# Patient Record
Sex: Female | Born: 1935
Health system: Southern US, Community
[De-identification: ages and names within clinical notes are randomized; demographics above are authoritative.]

## PROBLEM LIST (undated history)

## (undated) DIAGNOSIS — E785 Hyperlipidemia, unspecified: Secondary | ICD-10-CM

## (undated) DIAGNOSIS — M1990 Unspecified osteoarthritis, unspecified site: Secondary | ICD-10-CM

## (undated) DIAGNOSIS — F419 Anxiety disorder, unspecified: Secondary | ICD-10-CM

## (undated) DIAGNOSIS — I10 Essential (primary) hypertension: Secondary | ICD-10-CM

## (undated) DIAGNOSIS — M810 Age-related osteoporosis without current pathological fracture: Secondary | ICD-10-CM

## (undated) HISTORY — DX: Age-related osteoporosis without current pathological fracture: M81.0

## (undated) HISTORY — DX: Anxiety disorder, unspecified: F41.9

## (undated) HISTORY — DX: Hyperlipidemia, unspecified: E78.5

## (undated) HISTORY — DX: Unspecified osteoarthritis, unspecified site: M19.90

---

## 1997-10-24 HISTORY — PX: CHOLECYSTECTOMY: SHX55

## 1998-10-24 DIAGNOSIS — J301 Allergic rhinitis due to pollen: Secondary | ICD-10-CM | POA: Insufficient documentation

## 1998-10-24 DIAGNOSIS — N814 Uterovaginal prolapse, unspecified: Secondary | ICD-10-CM | POA: Insufficient documentation

## 1998-10-24 DIAGNOSIS — I10 Essential (primary) hypertension: Secondary | ICD-10-CM | POA: Insufficient documentation

## 2001-10-24 DIAGNOSIS — E785 Hyperlipidemia, unspecified: Secondary | ICD-10-CM | POA: Insufficient documentation

## 2003-10-25 DIAGNOSIS — M810 Age-related osteoporosis without current pathological fracture: Secondary | ICD-10-CM | POA: Insufficient documentation

## 2004-08-24 ENCOUNTER — Ambulatory Visit: Payer: Self-pay | Admitting: Obstetrics and Gynecology

## 2005-09-23 ENCOUNTER — Ambulatory Visit: Payer: Self-pay | Admitting: Obstetrics and Gynecology

## 2007-09-03 ENCOUNTER — Ambulatory Visit: Payer: Self-pay | Admitting: Family Medicine

## 2008-07-24 ENCOUNTER — Ambulatory Visit: Payer: Self-pay | Admitting: Family Medicine

## 2009-06-25 DIAGNOSIS — M545 Low back pain, unspecified: Secondary | ICD-10-CM | POA: Insufficient documentation

## 2009-08-03 DIAGNOSIS — E559 Vitamin D deficiency, unspecified: Secondary | ICD-10-CM | POA: Insufficient documentation

## 2009-10-24 HISTORY — PX: OTHER SURGICAL HISTORY: SHX169

## 2010-06-22 ENCOUNTER — Ambulatory Visit: Payer: Self-pay | Admitting: Obstetrics and Gynecology

## 2010-08-18 ENCOUNTER — Ambulatory Visit: Payer: Self-pay | Admitting: Family Medicine

## 2010-08-24 ENCOUNTER — Ambulatory Visit: Payer: Self-pay | Admitting: Specialist

## 2010-08-31 ENCOUNTER — Ambulatory Visit: Payer: Self-pay | Admitting: Specialist

## 2010-09-01 LAB — PATHOLOGY REPORT

## 2011-09-30 ENCOUNTER — Ambulatory Visit: Payer: Self-pay | Admitting: Obstetrics and Gynecology

## 2013-08-01 ENCOUNTER — Ambulatory Visit: Payer: Self-pay | Admitting: Family Medicine

## 2013-08-01 LAB — HM DEXA SCAN

## 2014-07-22 LAB — BASIC METABOLIC PANEL
BUN: 9 mg/dL (ref 4–21)
Creatinine: 0.8 mg/dL (ref 0.5–1.1)
GLUCOSE: 94 mg/dL
Potassium: 4 mmol/L (ref 3.4–5.3)
SODIUM: 135 mmol/L — AB (ref 137–147)

## 2014-07-22 LAB — LIPID PANEL
Cholesterol: 160 mg/dL (ref 0–200)
HDL: 64 mg/dL (ref 35–70)
LDL CALC: 80 mg/dL
Triglycerides: 80 mg/dL (ref 40–160)

## 2015-02-14 ENCOUNTER — Emergency Department
Admit: 2015-02-14 | Disposition: A | Discharge status: Home / Self Care | Payer: Self-pay | Admitting: Emergency Medicine

## 2015-02-14 LAB — HEPATIC FUNCTION PANEL A (ARMC)
ALK PHOS: 63 U/L
AST: 22 U/L
Albumin: 3.8 g/dL
Bilirubin, Direct: 0.1 mg/dL
Bilirubin,Total: 0.7 mg/dL
Indirect Bilirubin: 0.6
SGPT (ALT): 13 U/L — ABNORMAL LOW
Total Protein: 7.5 g/dL

## 2015-02-14 LAB — URINALYSIS, COMPLETE
Bilirubin,UR: NEGATIVE
Glucose,UR: NEGATIVE mg/dL
Ketone: NEGATIVE
Nitrite: NEGATIVE
Ph: 6
Protein: 30
Specific Gravity: 1.01

## 2015-02-14 LAB — CBC WITH DIFFERENTIAL/PLATELET
Basophil #: 0.1 10*3/uL (ref 0.0–0.1)
Basophil %: 1.2 %
EOS PCT: 1.4 %
Eosinophil #: 0.1 10*3/uL (ref 0.0–0.7)
HCT: 36.4 % (ref 35.0–47.0)
HGB: 12.2 g/dL (ref 12.0–16.0)
LYMPHS PCT: 31.1 %
Lymphocyte #: 2.3 10*3/uL (ref 1.0–3.6)
MCH: 30.5 pg (ref 26.0–34.0)
MCHC: 33.4 g/dL (ref 32.0–36.0)
MCV: 91 fL (ref 80–100)
MONO ABS: 0.6 x10 3/mm (ref 0.2–0.9)
MONOS PCT: 7.8 %
NEUTROS PCT: 58.5 %
Neutrophil #: 4.3 10*3/uL (ref 1.4–6.5)
PLATELETS: 234 10*3/uL (ref 150–440)
RBC: 3.99 10*6/uL (ref 3.80–5.20)
RDW: 13.1 % (ref 11.5–14.5)
WBC: 7.3 10*3/uL (ref 3.6–11.0)

## 2015-02-14 LAB — BASIC METABOLIC PANEL
Anion Gap: 6 — ABNORMAL LOW (ref 7–16)
BUN: 13 mg/dL
CALCIUM: 9.1 mg/dL
CHLORIDE: 101 mmol/L
Co2: 29 mmol/L
Creatinine: 1.02 mg/dL — ABNORMAL HIGH
GFR CALC NON AF AMER: 52 — AB
Glucose: 106 mg/dL — ABNORMAL HIGH
Potassium: 3.8 mmol/L
Sodium: 136 mmol/L

## 2015-02-14 LAB — TROPONIN I: Troponin-I: <0.03 ng/mL

## 2015-04-25 ENCOUNTER — Encounter: Payer: Self-pay | Admitting: Emergency Medicine

## 2015-04-25 ENCOUNTER — Emergency Department
Admission: EM | Admit: 2015-04-25 | Discharge: 2015-04-25 | Disposition: A | Discharge status: Home / Self Care | Payer: Medicare Other | Attending: Emergency Medicine | Admitting: Emergency Medicine

## 2015-04-25 ENCOUNTER — Emergency Department: Payer: Medicare Other

## 2015-04-25 DIAGNOSIS — S52502A Unspecified fracture of the lower end of left radius, initial encounter for closed fracture: Secondary | ICD-10-CM | POA: Diagnosis not present

## 2015-04-25 DIAGNOSIS — S5292XA Unspecified fracture of left forearm, initial encounter for closed fracture: Secondary | ICD-10-CM

## 2015-04-25 DIAGNOSIS — Y998 Other external cause status: Secondary | ICD-10-CM | POA: Insufficient documentation

## 2015-04-25 DIAGNOSIS — W1839XA Other fall on same level, initial encounter: Secondary | ICD-10-CM | POA: Insufficient documentation

## 2015-04-25 DIAGNOSIS — S6992XA Unspecified injury of left wrist, hand and finger(s), initial encounter: Secondary | ICD-10-CM | POA: Diagnosis present

## 2015-04-25 DIAGNOSIS — Y9289 Other specified places as the place of occurrence of the external cause: Secondary | ICD-10-CM | POA: Insufficient documentation

## 2015-04-25 DIAGNOSIS — I10 Essential (primary) hypertension: Secondary | ICD-10-CM | POA: Diagnosis not present

## 2015-04-25 DIAGNOSIS — Y9389 Activity, other specified: Secondary | ICD-10-CM | POA: Insufficient documentation

## 2015-04-25 DIAGNOSIS — Z87891 Personal history of nicotine dependence: Secondary | ICD-10-CM | POA: Insufficient documentation

## 2015-04-25 HISTORY — DX: Essential (primary) hypertension: I10

## 2015-04-25 MED ORDER — OXYCODONE HCL 5 MG PO TABS: 5.0000 mg | ORAL_TABLET | ORAL | Status: DC | PRN

## 2015-04-25 MED ORDER — OXYCODONE HCL 5 MG PO TABS
ORAL_TABLET | ORAL | Status: AC
  Filled 2015-04-25: qty 1

## 2015-04-25 MED ORDER — OXYCODONE HCL 5 MG PO TABS
5.0000 mg | ORAL_TABLET | Freq: Once | ORAL | Status: AC
  Administered 2015-04-25: 5 mg via ORAL

## 2015-04-25 NOTE — Discharge Instructions (Signed)

## 2015-04-25 NOTE — ED Provider Notes (Signed)
[facility] Emergency Department Provider Note ____________________________________________  Time seen: Approximately 3:38 PM  I have reviewed the triage vital signs and the nursing notes.   HISTORY  Chief Complaint Wrist Pain  HPI Gloria Mosley is a 81 y.o. female who presents to the emergency department after a mechanical, non syncopal fall while walking on her deck earlier today. She states there is a "bad place" on the deck and her foot caught and caused her fall. She put her arm out to prevent her from striking her head on the deck railing. She denies LOC   Past Medical History  Diagnosis Date  . Hypertension     There are no active problems to display for this patient.   Past Surgical History  Procedure Laterality Date  . Cholecystectomy      No current outpatient prescriptions on file.  Allergies Review of patient's allergies indicates no known allergies.  No family history on file.  Social History History  Substance Use Topics  . Smoking status: Former Games developer  . Smokeless tobacco: Not on file  . Alcohol Use: No    Review of Systems Constitutional: No recent illness. Eyes: No visual changes. ENT: No sore throat. Cardiovascular: Denies chest pain or palpitations. Respiratory: Denies shortness of breath. Gastrointestinal: No abdominal pain.  Genitourinary: Negative for dysuria. Musculoskeletal: Pain in left wrist.  Skin: Negative for rash. Neurological: Negative for headaches, focal weakness or numbness. 10-point ROS otherwise negative.  ____________________________________________   PHYSICAL EXAM:  VITAL SIGNS: ED Triage Vitals  Enc Vitals Group     BP 04/25/15 1422 163/98 mmHg     Pulse Rate 04/25/15 1422 62     Resp 04/25/15 1422 18     Temp 04/25/15 1422 97.7 F (36.5 C)     Temp Source 04/25/15 1422 Oral     SpO2 04/25/15 1422 100 %     Weight 04/25/15 1422 123 lb (55.792 kg)     Height 04/25/15 1422  (1.6 m)      Head Cir --      Peak Flow --      Pain Score 04/25/15 1424 10     Pain Loc --      Pain Edu? --      Excl. in GC? --    Constitutional: Alert and oriented. Well appearing and in no acute distress. Eyes: Conjunctivae are normal. EOMI. Head: Atraumatic. Nose: No congestion/rhinnorhea. Neck: No stridor.  Respiratory: Normal respiratory effort.   Musculoskeletal: Pain in left wrist. Full ROM of fingers. Neurologic:  Normal speech and language. No gross focal neurologic deficits are appreciated. Speech is normal. No gait instability. Skin:  Skin is warm, dry and intact. Atraumatic. Cardiovascular: Radial pulse 2+. Capillary refill <3. Psychiatric: Mood and affect are normal. Speech and behavior are normal.  ____________________________________________   LABS (all labs ordered are listed, but only abnormal results are displayed)  Labs Reviewed - No data to display ____________________________________________  RADIOLOGY  Impacted, comminuted fracture of the distal radius. ____________________________________________   PROCEDURES  Procedure(s) performed:  SPLINT APPLICATION Date/Time: 4:48 PM Authorized by: Kem Boroughs Consent: Verbal consent obtained. Risks and benefits: risks, benefits and alternatives were discussed Consent given by: patient Splint applied by: ER tech  Location details: Volar aspect left hand/wrist Splint type: OCL Supplies used: OCL and ACE Post-procedure: The splinted body part was neurovascularly unchanged following the procedure. Patient tolerance: Patient tolerated the procedure well with no immediate complications.      ____________________________________________  INITIAL IMPRESSION / ASSESSMENT AND PLAN / ED COURSE  Pertinent labs & imaging results that were available during my care of the patient were reviewed by me and considered in my medical decision making (see chart for  details).  ____________________________________________   FINAL CLINICAL IMPRESSION(S) / ED DIAGNOSES  Final diagnoses:  None   Will await radiology reading and speak with orthopedics on call.  ----------------------------------------- 3:43 PM on 04/25/2015 -----------------------------------------  Spoke with Dr.Sloboda. Plan to splint and have follow up as outpatient.  Plan and return precautions discussed with the patient and family who agree.    Chinita PesterCari B Godric Lavell, FNP 04/25/15 1649  Arnaldo NatalPaul F Malinda, MD 04/26/15 [phone]

## 2015-04-25 NOTE — ED Notes (Signed)
Mechanical fall

## 2015-04-25 NOTE — ED Notes (Signed)
NAD noted at this time. Pt taken to lobby via wheelchair. Pt denies pain at this time.

## 2015-05-20 ENCOUNTER — Other Ambulatory Visit: Payer: Self-pay | Admitting: Family Medicine

## 2015-05-20 NOTE — Telephone Encounter (Signed)
Last office visit was on 12/30/2014

## 2015-05-28 DIAGNOSIS — S52539A Colles' fracture of unspecified radius, initial encounter for closed fracture: Secondary | ICD-10-CM | POA: Insufficient documentation

## 2015-06-25 DIAGNOSIS — M189 Osteoarthritis of first carpometacarpal joint, unspecified: Secondary | ICD-10-CM | POA: Insufficient documentation

## 2015-07-22 ENCOUNTER — Encounter: Payer: Self-pay | Admitting: Family Medicine

## 2015-07-22 ENCOUNTER — Ambulatory Visit (INDEPENDENT_AMBULATORY_CARE_PROVIDER_SITE_OTHER): Payer: Medicare Other | Admitting: Family Medicine

## 2015-07-22 ENCOUNTER — Other Ambulatory Visit: Payer: Self-pay | Admitting: Family Medicine

## 2015-07-22 VITALS — BP 130/66 | HR 63 | Temp 98.1°F | Resp 16 | Ht 63.0 in | Wt 119.0 lb

## 2015-07-22 DIAGNOSIS — R200 Anesthesia of skin: Secondary | ICD-10-CM | POA: Insufficient documentation

## 2015-07-22 DIAGNOSIS — Z23 Encounter for immunization: Secondary | ICD-10-CM | POA: Diagnosis not present

## 2015-07-22 DIAGNOSIS — R42 Dizziness and giddiness: Secondary | ICD-10-CM | POA: Insufficient documentation

## 2015-07-22 DIAGNOSIS — M720 Palmar fascial fibromatosis [Dupuytren]: Secondary | ICD-10-CM | POA: Insufficient documentation

## 2015-07-22 DIAGNOSIS — R300 Dysuria: Secondary | ICD-10-CM | POA: Diagnosis not present

## 2015-07-22 DIAGNOSIS — N3001 Acute cystitis with hematuria: Secondary | ICD-10-CM | POA: Diagnosis not present

## 2015-07-22 DIAGNOSIS — R634 Abnormal weight loss: Secondary | ICD-10-CM | POA: Insufficient documentation

## 2015-07-22 DIAGNOSIS — R202 Paresthesia of skin: Secondary | ICD-10-CM

## 2015-07-22 LAB — POCT URINALYSIS DIPSTICK
BILIRUBIN UA: NEGATIVE
Glucose, UA: NEGATIVE
Ketones, UA: NEGATIVE
NITRITE UA: POSITIVE
Spec Grav, UA: 1.015
Urobilinogen, UA: 0.2
pH, UA: 6

## 2015-07-22 MED ORDER — CIPROFLOXACIN HCL 500 MG PO TABS: 500.0000 mg | ORAL_TABLET | Freq: Two times a day (BID) | ORAL | Status: AC

## 2015-07-22 NOTE — Progress Notes (Signed)
       Patient: Gloria Mosley Female    DOB: [birth year]   81 y.o.   MRN: [id number] Visit Date: 07/22/2015  Today's Provider: Mila Merry, MD   Chief Complaint  Patient presents with  . Dysuria    x 2 months   Subjective:    Dysuria  This is a new problem. Episode onset: 2 months. The problem occurs every urination. The problem has been unchanged. The quality of the pain is described as burning. There has been no fever. Associated symptoms include chills, frequency and urgency. Pertinent negatives include no discharge, flank pain, hematuria, hesitancy, nausea, possible pregnancy, sweats or vomiting. She has tried NSAIDs and increased fluids for the symptoms. The treatment provided no relief. Her past medical history is significant for recurrent UTIs.  Patient uses a [facility].      No Known Allergies Previous Medications   ATORVASTATIN (LIPITOR) 10 MG TABLET    TAKE 1 TABLET EVERY OTHER DAY   BISOPROLOL-HYDROCHLOROTHIAZIDE (ZIAC) 5-6.25 MG TABLET    Take 1 tablet by mouth daily.    Review of Systems  Constitutional: Positive for chills. Negative for fever, appetite change and fatigue.  Respiratory: Negative for chest tightness and shortness of breath.   Cardiovascular: Negative for chest pain and palpitations.  Gastrointestinal: Negative for nausea, vomiting and abdominal pain.  Genitourinary: Positive for dysuria, urgency and frequency. Negative for hesitancy, hematuria and flank pain.  Neurological: Negative for dizziness and weakness.    Social History  Substance Use Topics  . Smoking status: Former Smoker -- 1.00 packs/day    Types: Cigarettes    Quit date: 10/24/1950  . Smokeless tobacco: Not on file  . Alcohol Use: No   Objective:   BP 130/66 mmHg  Pulse 63  Temp(Src) 98.1 F (36.7 C) (Oral)  Resp 16  SpO2 95%  Physical Exam   General appearance: alert, well developed, well nourished, cooperative and in no distress Head: Normocephalic, without obvious  abnormality, atraumatic Lungs: Respirations even and unlabored Extremities: No gross deformities Skin: Skin color, texture, turgor normal. No rashes seen  Psych: Appropriate mood and affect. Neurologic: Mental status: Alert, oriented to person, place, and time, thought content appropriate. Abd: Mild suprapubic tenderness. No CVAT  Results for orders placed or performed in visit on 07/22/15  POCT Urinalysis Dipstick  Result Value Ref Range   Color, UA yellow    Clarity, UA cloudy    Glucose, UA negative    Bilirubin, UA Negative    Ketones, UA Negative    Spec Grav, UA 1.015    Blood, UA Large Hemolyzed    pH, UA 6.0    Protein, UA Trace    Urobilinogen, UA 0.2    Nitrite, UA positive    Leukocytes, UA moderate (2+) (A) Negative       Assessment & Plan:           Mila Merry, MD  [facility]

## 2015-07-25 ENCOUNTER — Telehealth: Payer: Self-pay

## 2015-07-25 LAB — URINE CULTURE

## 2015-07-25 NOTE — Telephone Encounter (Signed)
-----   Message from Malva Limes, MD sent at 07/25/2015  8:54 AM EDT ----- Urine culture shows infection sensitive to antibiotic that was prescribed. Symptoms should completely resolve by the time antibiotic is finished. Call back otherwise.

## 2015-07-25 NOTE — Telephone Encounter (Signed)
Pt advised as directed below.   Thanks,   -Laura  

## 2015-09-23 ENCOUNTER — Other Ambulatory Visit: Payer: Self-pay | Admitting: Family Medicine

## 2015-11-26 DIAGNOSIS — L929 Granulomatous disorder of the skin and subcutaneous tissue, unspecified: Secondary | ICD-10-CM | POA: Diagnosis not present

## 2015-11-26 DIAGNOSIS — N898 Other specified noninflammatory disorders of vagina: Secondary | ICD-10-CM | POA: Diagnosis not present

## 2015-11-26 DIAGNOSIS — N8189 Other female genital prolapse: Secondary | ICD-10-CM | POA: Diagnosis not present

## 2015-12-18 ENCOUNTER — Other Ambulatory Visit: Payer: Self-pay | Admitting: Family Medicine

## 2016-01-06 ENCOUNTER — Encounter: Payer: Self-pay | Admitting: Family Medicine

## 2016-01-06 ENCOUNTER — Ambulatory Visit (INDEPENDENT_AMBULATORY_CARE_PROVIDER_SITE_OTHER): Payer: Medicare Other | Admitting: Family Medicine

## 2016-01-06 VITALS — BP 142/74 | HR 54 | Temp 98.1°F | Resp 16 | Ht 63.0 in | Wt 124.0 lb

## 2016-01-06 DIAGNOSIS — R300 Dysuria: Secondary | ICD-10-CM | POA: Diagnosis not present

## 2016-01-06 DIAGNOSIS — N3001 Acute cystitis with hematuria: Secondary | ICD-10-CM

## 2016-01-06 LAB — POCT URINALYSIS DIPSTICK
Bilirubin, UA: NEGATIVE
Glucose, UA: NEGATIVE
KETONES UA: NEGATIVE
Nitrite, UA: NEGATIVE
PH UA: 6
SPEC GRAV UA: 1.01
Urobilinogen, UA: 2

## 2016-01-06 MED ORDER — CIPROFLOXACIN HCL 500 MG PO TABS: 500.0000 mg | ORAL_TABLET | Freq: Two times a day (BID) | ORAL | Status: AC

## 2016-01-06 NOTE — Progress Notes (Signed)
       Patient: Gloria Mosley Female    DOB: 1933-02-14   82 y.o.   MRN: [id number] Visit Date: 01/06/2016  Today's Provider: Mila Merryonald Markayla Reichart, MD   Chief Complaint  Patient presents with  . Blood Pressure Check  . Dysuria   Subjective:         Hypertension, follow-up:  BP Readings from Last 3 Encounters:  01/06/16 142/74  07/22/15 130/66  12/30/14 166/72    She was last seen for hypertension 1 years ago.  BP at that visit was 166/72. Management since that visit includes; discontinued carvedilol due to intolerance. Restarted bisprolol-HCTZ 5-6.25 mg qd..She reports good compliance with treatment. She is not having side effects. none  She house work exercising. She is not adherent to low salt diet.   Outside blood pressures are elevated blood pressure checked at pharmacy. She is experiencing none.  Patient denies none.   Cardiovascular risk factors include none.  Use of agents associated with hypertension: none.   ----------------------------------------------------------------------    Dysuria  This is a recurrent problem. The current episode started in the past 7 days (2 days). The problem occurs every urination. The problem has been unchanged. The quality of the pain is described as burning. The pain is at a severity of 5/10. The pain is moderate. There has been no fever. Associated symptoms include chills, flank pain, frequency, hesitancy and urgency. Pertinent negatives include no discharge, hematuria, nausea, possible pregnancy, sweats or vomiting. She has tried acetaminophen for the symptoms. The treatment provided mild relief. Her past medical history is significant for recurrent UTIs.    Burning urination with abdominal pain for past 2 days. Frequency, urgency, burning pain with every urination. Some abdominal pain.   No Known Allergies Previous Medications   ATORVASTATIN (LIPITOR) 10 MG TABLET    TAKE 1 TABLET EVERY OTHER DAY   BISOPROLOL-HYDROCHLOROTHIAZIDE  (ZIAC) 5-6.25 MG TABLET    TAKE 1 TABLET DAILY   RALOXIFENE (EVISTA) 60 MG TABLET    TAKE 1 TABLET DAILY    Review of Systems  Constitutional: Positive for chills. Negative for fever, appetite change and fatigue.  Respiratory: Negative for chest tightness and shortness of breath.   Cardiovascular: Negative for chest pain and palpitations.  Gastrointestinal: Positive for abdominal distention. Negative for nausea, vomiting and abdominal pain.  Genitourinary: Positive for dysuria, hesitancy, urgency, frequency and flank pain. Negative for hematuria.  Neurological: Negative for dizziness and weakness.    Social History  Substance Use Topics  . Smoking status: Former Smoker -- 1.00 packs/day    Types: Cigarettes    Quit date: 10/24/1950  . Smokeless tobacco: Not on file  . Alcohol Use: No   Objective:   BP 142/74 mmHg  Pulse 54  Temp(Src) 98.1 F (36.7 C) (Oral)  Resp 16  Ht 5\' 3"  (1.6 m)  Wt 124 lb (56.246 kg)  BMI 21.97 kg/m2  SpO2 93%  Physical Exam  General appearance: alert, well developed, well nourished, cooperative and in no distress Head: Normocephalic, without obvious abnormality, atraumatic Lungs: Respirations even and unlabored Abd: Mild suprapubic tenderness, no CVA tenderness.      Assessment & Plan:     1. Dysuria  - POCT urinalysis dipstick - Urine culture  2. Acute cystitis with hematuria  - Urine culture        Mila Merryonald Jessa Stinson, MD  [facility]

## 2016-01-11 LAB — URINE CULTURE

## 2016-02-24 DIAGNOSIS — Z4689 Encounter for fitting and adjustment of other specified devices: Secondary | ICD-10-CM | POA: Diagnosis not present

## 2016-02-24 DIAGNOSIS — N8189 Other female genital prolapse: Secondary | ICD-10-CM | POA: Diagnosis not present

## 2016-02-24 DIAGNOSIS — N898 Other specified noninflammatory disorders of vagina: Secondary | ICD-10-CM | POA: Diagnosis not present

## 2016-02-24 DIAGNOSIS — L928 Other granulomatous disorders of the skin and subcutaneous tissue: Secondary | ICD-10-CM | POA: Diagnosis not present

## 2016-06-10 ENCOUNTER — Other Ambulatory Visit: Payer: Self-pay | Admitting: Family Medicine

## 2016-06-21 DIAGNOSIS — N898 Other specified noninflammatory disorders of vagina: Secondary | ICD-10-CM | POA: Diagnosis not present

## 2016-06-21 DIAGNOSIS — N8189 Other female genital prolapse: Secondary | ICD-10-CM | POA: Diagnosis not present

## 2016-07-13 ENCOUNTER — Telehealth: Payer: Self-pay | Admitting: Family Medicine

## 2016-07-21 ENCOUNTER — Ambulatory Visit: Payer: Medicare Other

## 2016-08-02 ENCOUNTER — Encounter: Payer: Self-pay | Admitting: Family Medicine

## 2016-08-02 ENCOUNTER — Ambulatory Visit (INDEPENDENT_AMBULATORY_CARE_PROVIDER_SITE_OTHER): Payer: Medicare Other | Admitting: Family Medicine

## 2016-08-02 VITALS — BP 134/60 | HR 53 | Temp 97.9°F | Resp 16 | Ht 63.0 in | Wt 124.0 lb

## 2016-08-02 DIAGNOSIS — I10 Essential (primary) hypertension: Secondary | ICD-10-CM

## 2016-08-02 DIAGNOSIS — M810 Age-related osteoporosis without current pathological fracture: Secondary | ICD-10-CM | POA: Diagnosis not present

## 2016-08-02 DIAGNOSIS — N3001 Acute cystitis with hematuria: Secondary | ICD-10-CM | POA: Diagnosis not present

## 2016-08-02 DIAGNOSIS — E559 Vitamin D deficiency, unspecified: Secondary | ICD-10-CM

## 2016-08-02 DIAGNOSIS — Z23 Encounter for immunization: Secondary | ICD-10-CM

## 2016-08-02 DIAGNOSIS — E785 Hyperlipidemia, unspecified: Secondary | ICD-10-CM

## 2016-08-02 DIAGNOSIS — R300 Dysuria: Secondary | ICD-10-CM

## 2016-08-02 LAB — POCT URINALYSIS DIPSTICK
Bilirubin, UA: NEGATIVE
Glucose, UA: NEGATIVE
Ketones, UA: NEGATIVE
NITRITE UA: NEGATIVE
Spec Grav, UA: 1.01
UROBILINOGEN UA: 0.2
pH, UA: 6.5

## 2016-08-02 MED ORDER — CIPROFLOXACIN HCL 500 MG PO TABS: 500.0000 mg | ORAL_TABLET | Freq: Two times a day (BID) | ORAL | 0 refills | Status: AC

## 2016-08-02 NOTE — Progress Notes (Signed)
Patient: Gloria Mosley Female    DOB: [birth year]   82 y.o.   MRN: [id number] Visit Date: 08/02/2016  Today's Provider: Mila Merryonald Fisher, MD   Chief Complaint  Patient presents with  . Follow-up  . Hypertension  . Hyperlipidemia   Subjective:    Patient has been having dysuria and some frequency for the last few days. Patient has recurrent UTI's. No blood and no fever.     Osteoporosis: From 38/2016-discontinued evista due to not being covered by insurance. Has not taken any other medications for osteoporosis. Is now back on Evista, taking consistently and tolerating well.   Vitamin D Deficiency:  From 12/30/2014-started vitamin D 2,000 units qd. Taking consistently and tolerating well.     Hypertension, follow-up:  BP Readings from Last 3 Encounters:  08/02/16 134/60  01/06/16 (!) 142/74  07/22/15 130/66    She was last seen for hypertension 12/30/2014.  BP at that visit was 142/74. Management since that visit includes; discontinued carvedilol and restarted Ziac .She reports good compliance with treatment. She is not having side effects. none She is not exercising. She is adherent to low salt diet.   Outside blood pressures are n/a. She is experiencing none.  Patient denies none.   Cardiovascular risk factors include none.  Use of agents associated with hypertension: none.   ----------------------------------------------------------------    Lipid/Cholesterol, Follow-up:   Last seen for this 07/22/2014.  Management since that visit includes; no changes.  Last Lipid Panel:    Component Value Date/Time   CHOL 160 07/22/2014   TRIG 80 07/22/2014   HDL 64 07/22/2014   LDLCALC 80 07/22/2014    She reports good compliance with treatment. She is not having side effects. none  Wt Readings from Last 3 Encounters:  08/02/16 124 lb (56.2 kg)  01/06/16 124 lb (56.2 kg)  07/22/15 119 lb (54 kg)     ----------------------------------------------------------------    No Known Allergies   Current Outpatient Prescriptions:  .  atorvastatin (LIPITOR) 10 MG tablet, TAKE 1 TABLET EVERY OTHER DAY, Disp: 30 tablet, Rfl: 11 .  bisoprolol-hydrochlorothiazide (ZIAC) 5-6.25 MG tablet, TAKE 1 TABLET DAILY, Disp: 30 tablet, Rfl: 10 .  raloxifene (EVISTA) 60 MG tablet, TAKE 1 TABLET DAILY, Disp: 30 tablet, Rfl: 12  Review of Systems  Constitutional: Negative for appetite change, chills, fatigue and fever.  Respiratory: Negative for chest tightness and shortness of breath.   Cardiovascular: Negative for chest pain and palpitations.  Gastrointestinal: Negative for abdominal pain, nausea and vomiting.  Genitourinary: Positive for dysuria. Negative for frequency and urgency.  Neurological: Negative for dizziness and weakness.    Social History  Substance Use Topics  . Smoking status: Former Smoker    Packs/day: 1.00    Types: Cigarettes    Quit date: 10/24/1950  . Smokeless tobacco: Not on file  . Alcohol use No   Objective:   BP 134/60 (BP Location: Left Arm, Patient Position: Sitting, Cuff Size: Normal)   Pulse (!) 53   Temp 97.9 F (36.6 C) (Oral)   Resp 16   Ht 5\' 3"  (1.6 m)   Wt 124 lb (56.2 kg)   SpO2 98%   BMI 21.97 kg/m    Fall Risk  08/02/2016  Falls in the past year? No   Depression screen PHQ 2/9 08/02/2016  Decreased Interest 0  Down, Depressed, Hopeless 0  PHQ - 2 Score 0  Altered sleeping 0  Tired, decreased energy 0  Change in appetite  0  Feeling bad or failure about yourself  0  Trouble concentrating 0  Moving slowly or fidgety/restless 0  Suicidal thoughts 0  PHQ-9 Score 0  Difficult doing work/chores Not difficult at all     Physical Exam   General Appearance:    Alert, cooperative, no distress  Eyes:    PERRL, conjunctiva/corneas clear, EOM's intact       Lungs:     Clear to auscultation bilaterally, respirations unlabored  Heart:    Regular  rate and rhythm  Neurologic:   Awake, alert, oriented x 3. No apparent focal neurological           defect.       Results for orders placed or performed in visit on 08/02/16  POCT urinalysis dipstick  Result Value Ref Range   Color, UA Yellow    Clarity, UA Slightly Cloudy    Glucose, UA Neg    Bilirubin, UA Neg    Ketones, UA Neg    Spec Grav, UA 1.010    Blood, UA Large    pH, UA 6.5    Protein, UA trace    Urobilinogen, UA 0.2    Nitrite, UA Neg    Leukocytes, UA large (3+) (A) Negative        Assessment & Plan:     1. Need for influenza vaccination  - Flu vaccine HIGH DOSE PF  2. Dysuria  - POCT urinalysis dipstick - Urine culture  3. Acute cystitis with hematuria  - ciprofloxacin (CIPRO) 500 MG tablet; Take 1 tablet (500 mg total) by mouth 2 (two) times daily.  Dispense: 14 tablet; Refill: 0  4. Essential (primary) hypertension Well controlled.  Continue current medications.   - Renal function panel  5. Osteoporosis, senile Tolerating Evista well, discussed alternative such as Prolia and bisphosphonate.  - DG Bone Density; Future  6. Vitamin D deficiency On vitamin d supplementation - VITAMIN D 25 Hydroxy (Vit-D Deficiency, Fractures)  7. Hyperlipidemia, unspecified hyperlipidemia type She is tolerating atorvastatin well with no adverse effects.   - Hepatic function panel - Lipid panel  8. Need for pneumococcal vaccination Prevnar-13     The entirety of the information documented in the History of Present Illness, Review of Systems and Physical Exam were personally obtained by me. Portions of this information were initially documented by April M. Hyacinth Meeker, CMA and reviewed by me for thoroughness and accuracy.    Mila Merry, MD  [facility]

## 2016-08-03 DIAGNOSIS — E785 Hyperlipidemia, unspecified: Secondary | ICD-10-CM | POA: Diagnosis not present

## 2016-08-03 DIAGNOSIS — E559 Vitamin D deficiency, unspecified: Secondary | ICD-10-CM | POA: Diagnosis not present

## 2016-08-03 DIAGNOSIS — I10 Essential (primary) hypertension: Secondary | ICD-10-CM | POA: Diagnosis not present

## 2016-08-04 ENCOUNTER — Other Ambulatory Visit: Payer: Self-pay | Admitting: Family Medicine

## 2016-08-04 LAB — RENAL FUNCTION PANEL
Albumin: 4.3 g/dL (ref 3.5–4.7)
BUN/Creatinine Ratio: 12 (ref 12–28)
BUN: 12 mg/dL (ref 8–27)
CHLORIDE: 94 mmol/L — AB (ref 96–106)
CO2: 26 mmol/L (ref 18–29)
CREATININE: 1 mg/dL (ref 0.57–1.00)
Calcium: 9.4 mg/dL (ref 8.7–10.3)
GFR, EST AFRICAN AMERICAN: 61 mL/min/{1.73_m2} (ref >59)
GFR, EST NON AFRICAN AMERICAN: 53 mL/min/{1.73_m2} — AB (ref >59)
Glucose: 92 mg/dL (ref 65–99)
Phosphorus: 3.7 mg/dL (ref 2.5–4.5)
Potassium: 4.2 mmol/L (ref 3.5–5.2)
Sodium: 135 mmol/L (ref 134–144)

## 2016-08-04 LAB — LIPID PANEL
Chol/HDL Ratio: 2.6 ratio units (ref 0.0–4.4)
Cholesterol, Total: 161 mg/dL (ref 100–199)
HDL: 61 mg/dL (ref >39)
LDL Calculated: 88 mg/dL (ref 0–99)
Triglycerides: 61 mg/dL (ref 0–149)
VLDL Cholesterol Cal: 12 mg/dL (ref 5–40)

## 2016-08-04 LAB — HEPATIC FUNCTION PANEL
ALK PHOS: 68 IU/L (ref 39–117)
ALT: 10 IU/L (ref 0–32)
AST: 20 IU/L (ref 0–40)
BILIRUBIN, DIRECT: 0.24 mg/dL (ref 0.00–0.40)
Bilirubin Total: 1 mg/dL (ref 0.0–1.2)
Total Protein: 7.4 g/dL (ref 6.0–8.5)

## 2016-08-04 LAB — VITAMIN D 25 HYDROXY (VIT D DEFICIENCY, FRACTURES): Vit D, 25-Hydroxy: 24.4 ng/mL — ABNORMAL LOW (ref 30.0–100.0)

## 2016-08-04 LAB — URINE CULTURE

## 2016-08-04 MED ORDER — VITAMIN D 50 MCG (2000 UT) PO CAPS: 2000.0000 [IU] | ORAL_CAPSULE | Freq: Every day | ORAL | 0 refills | Status: DC

## 2016-08-05 ENCOUNTER — Telehealth: Payer: Self-pay

## 2016-08-05 NOTE — Telephone Encounter (Signed)
-----   Message from Malva Limesonald E Fisher, MD sent at 08/05/2016 11:10 AM EDT ----- Urine culture shows infection sensitive to antibiotic that was prescribed. Symptoms should completely resolve by the time antibiotic is finished. Call back otherwise.

## 2016-08-05 NOTE — Telephone Encounter (Signed)
Left message to call back  

## 2016-08-09 NOTE — Telephone Encounter (Signed)
Patient was notified of results. Patient expressed understanding. 

## 2016-09-21 DIAGNOSIS — N8189 Other female genital prolapse: Secondary | ICD-10-CM | POA: Diagnosis not present

## 2016-09-21 DIAGNOSIS — L928 Other granulomatous disorders of the skin and subcutaneous tissue: Secondary | ICD-10-CM | POA: Diagnosis not present

## 2016-09-21 DIAGNOSIS — Z4689 Encounter for fitting and adjustment of other specified devices: Secondary | ICD-10-CM | POA: Diagnosis not present

## 2016-09-26 ENCOUNTER — Ambulatory Visit
Admission: RE | Admit: 2016-09-26 | Discharge: 2016-09-26 | Disposition: A | Discharge status: Home / Self Care | Payer: Medicare Other | Source: Ambulatory Visit | Attending: Family Medicine | Admitting: Family Medicine

## 2016-09-26 ENCOUNTER — Telehealth: Payer: Self-pay

## 2016-09-26 DIAGNOSIS — E2839 Other primary ovarian failure: Secondary | ICD-10-CM | POA: Insufficient documentation

## 2016-09-26 DIAGNOSIS — M810 Age-related osteoporosis without current pathological fracture: Secondary | ICD-10-CM

## 2016-09-26 DIAGNOSIS — M8588 Other specified disorders of bone density and structure, other site: Secondary | ICD-10-CM | POA: Insufficient documentation

## 2016-09-26 DIAGNOSIS — M85852 Other specified disorders of bone density and structure, left thigh: Secondary | ICD-10-CM | POA: Diagnosis not present

## 2016-09-26 NOTE — Telephone Encounter (Signed)
-----   Message from Malva Limesonald E Fisher, MD sent at 09/26/2016  1:43 PM EST ----- Bone density test shows borderline osteoporosis, stable from 2014. Continue current medications.  Check Q3 years.

## 2016-09-26 NOTE — Telephone Encounter (Signed)
Advised patient of results.  

## 2016-10-10 ENCOUNTER — Ambulatory Visit (INDEPENDENT_AMBULATORY_CARE_PROVIDER_SITE_OTHER): Payer: Medicare Other | Admitting: Family Medicine

## 2016-10-10 ENCOUNTER — Encounter: Payer: Self-pay | Admitting: Family Medicine

## 2016-10-10 VITALS — BP 140/62 | HR 74 | Temp 98.1°F | Resp 20 | Wt 118.0 lb

## 2016-10-10 DIAGNOSIS — J019 Acute sinusitis, unspecified: Secondary | ICD-10-CM

## 2016-10-10 DIAGNOSIS — J40 Bronchitis, not specified as acute or chronic: Secondary | ICD-10-CM | POA: Diagnosis not present

## 2016-10-10 MED ORDER — FLUTICASONE PROPIONATE 50 MCG/ACT NA SUSP: 2.0000 | Freq: Every day | NASAL | 6 refills | Status: AC

## 2016-10-10 MED ORDER — LEVOFLOXACIN 750 MG PO TABS: 750.0000 mg | ORAL_TABLET | Freq: Every day | ORAL | 0 refills | Status: DC

## 2016-10-10 NOTE — Progress Notes (Signed)
Patient: Gloria OmanJoan Hartsough Female    DOB: [birth year]   82 y.o.   MRN: [id number] Visit Date: 10/10/2016  Today's Provider: Mila Merryonald Brianda Beitler, MD   Chief Complaint  Patient presents with  . Cough    x 1 week   Subjective:    Cough  This is a new problem. Episode onset: 1 week ago. The problem has been gradually worsening. The cough is productive of sputum. Associated symptoms include chills, myalgias, nasal congestion, rhinorrhea, a sore throat, sweats and weight loss. Pertinent negatives include no chest pain, ear congestion, ear pain, fever, headaches, hemoptysis, postnasal drip, shortness of breath or wheezing. She has tried nothing for the symptoms.  Patient reports symptoms started 1 week ago after passing out . Patient is not able to get foods down due to gagging reflex. She reports she has also had a decrease in appetitie.   States she woke up feeling weak one week ago. She got up and brushed her teeth, then became light headed at nearly passed out. Has not felt light headed since then, but has developed persistent cough as above.      No Known Allergies   Current Outpatient Prescriptions:  .  atorvastatin (LIPITOR) 10 MG tablet, TAKE 1 TABLET EVERY OTHER DAY, Disp: 30 tablet, Rfl: 11 .  bisoprolol-hydrochlorothiazide (ZIAC) 5-6.25 MG tablet, TAKE 1 TABLET DAILY, Disp: 30 tablet, Rfl: 10 .  raloxifene (EVISTA) 60 MG tablet, TAKE 1 TABLET DAILY, Disp: 30 tablet, Rfl: 12 .  Cholecalciferol (VITAMIN D) 2000 units CAPS, Take 1 capsule (2,000 Units total) by mouth daily. (Patient not taking: Reported on 10/10/2016), Disp: 1 capsule, Rfl: 0  Review of Systems  Constitutional: Positive for appetite change, chills, diaphoresis, fatigue and weight loss. Negative for fever.  HENT: Positive for congestion, rhinorrhea and sore throat. Negative for ear pain and postnasal drip.   Respiratory: Positive for cough (productive with clear phlegm). Negative for hemoptysis, chest tightness, shortness  of breath and wheezing.   Cardiovascular: Negative for chest pain and palpitations.  Gastrointestinal: Negative for abdominal pain, nausea and vomiting.  Musculoskeletal: Positive for myalgias.  Neurological: Positive for weakness. Negative for dizziness and headaches.    Social History  Substance Use Topics  . Smoking status: Former Smoker    Packs/day: 1.00    Types: Cigarettes    Quit date: 10/24/1950  . Smokeless tobacco: Never Used  . Alcohol use No   Objective:   BP 140/62 (BP Location: Left Arm, Patient Position: Sitting, Cuff Size: Normal)   Pulse 74   Temp 98.1 F (36.7 C) (Oral)   Resp 20   Wt 118 lb (53.5 kg)   SpO2 100% Comment: room air  BMI 20.90 kg/m   Physical Exam  General Appearance:    Alert, cooperative, no distress  HENT:   bilateral TM normal without fluid or infection, neck without nodes, maxillary sinus tender and nasal mucosa pale and congested  Eyes:    PERRL, conjunctiva/corneas clear, EOM's intact       Lungs:     Clear to auscultation bilaterally, respirations unlabored  Heart:    Regular rate and rhythm  Neurologic:   Awake, alert, oriented x 3. No apparent focal neurological           defect.           Assessment & Plan:     1. Bronchitis  - levofloxacin (LEVAQUIN) 750 MG tablet; Take 1 tablet (750 mg total) by mouth daily.  Dispense: 7 tablet; Refill: 0  2. Acute sinusitis, recurrence not specified, unspecified location  - fluticasone (FLONASE) 50 MCG/ACT nasal spray; Place 2 sprays into both nostrils daily.  Dispense: 16 g; Refill: 6  The entirety of the information documented in the History of Present Illness, Review of Systems and Physical Exam were personally obtained by me. Portions of this information were initially documented by Awilda Billoshena Chambers, CMA and reviewed by me for thoroughness and accuracy.         Mila Merryonald Emonii Wienke, MD  [facility]

## 2016-10-12 ENCOUNTER — Other Ambulatory Visit: Payer: Self-pay | Admitting: Family Medicine

## 2016-10-12 ENCOUNTER — Telehealth: Payer: Self-pay

## 2016-10-12 MED ORDER — CEFDINIR 300 MG PO CAPS: 300.0000 mg | ORAL_CAPSULE | Freq: Two times a day (BID) | ORAL | 0 refills | Status: DC

## 2016-10-12 NOTE — Telephone Encounter (Signed)
Patient's son is calling saying that ever since she has started the Levaquin, she has had tremors. He reports that the patient started the medication Monday around 5pm, and Tuesday morning she had a mild tremor. He reports that she took another dose on Tuesday afternoon around 4:30, and by 8pm her tremor was so bad that she could not hold a glass of water. He reports that she was unable to sleep all last night due to the tremors. He reports that he did read the label on Levaquin and it says that it can cause seizures and tremors. He is requesting that the patient take something different. [facility] Contact number is correct. Thanks!

## 2016-10-12 NOTE — Telephone Encounter (Signed)
Advised patient's son as below. Medication was sent into the pharmacy.

## 2016-10-12 NOTE — Telephone Encounter (Signed)
Can change to cefdinir 300mg , two tablet daily for 7 days.

## 2016-12-07 ENCOUNTER — Other Ambulatory Visit: Payer: Self-pay | Admitting: Family Medicine

## 2016-12-19 ENCOUNTER — Telehealth: Payer: Self-pay | Admitting: Family Medicine

## 2016-12-19 NOTE — Telephone Encounter (Signed)
Please review. Yoselyn Mcglade Drozdowski, CMA  

## 2016-12-19 NOTE — Telephone Encounter (Signed)
Pt's son stated that he was advised by [facility] that her bisoprolol-hydrochlorothiazide [facility](ZIAC) 5-6.25 MG tablet isn't covered and Loraine LericheMark wanted to know if we were going to try to get it covered or change pt's medication. Please advise. Thanks TNP

## 2016-12-21 DIAGNOSIS — N8189 Other female genital prolapse: Secondary | ICD-10-CM | POA: Diagnosis not present

## 2016-12-21 DIAGNOSIS — Z4689 Encounter for fitting and adjustment of other specified devices: Secondary | ICD-10-CM | POA: Diagnosis not present

## 2016-12-21 DIAGNOSIS — L928 Other granulomatous disorders of the skin and subcutaneous tissue: Secondary | ICD-10-CM | POA: Diagnosis not present

## 2016-12-22 NOTE — Telephone Encounter (Signed)
Will need to change to metoprolol ER 25mg  once a day, #30, rf x 3, and hctz 12.5mg  once a day #30, rf x3. Please advise patient that these TWO medications will replace Ziac. Need to schedule follow up for BP in 6-8 weeks.

## 2016-12-26 MED ORDER — METOPROLOL SUCCINATE ER 25 MG PO TB24: 25.0000 mg | ORAL_TABLET | Freq: Every day | ORAL | 3 refills | Status: DC

## 2016-12-26 MED ORDER — HYDROCHLOROTHIAZIDE 12.5 MG PO TABS: 12.5000 mg | ORAL_TABLET | Freq: Every day | ORAL | 3 refills | Status: DC

## 2016-12-26 NOTE — Telephone Encounter (Signed)
Advised patient's son as below. Medications were sent into the pharmacy. They will call and schedule appt in 6-8 weeks.

## 2016-12-26 NOTE — Telephone Encounter (Signed)
Pt son mark is calling to request a call back.  ZO#[phone]/WJCB#[phone]/MW

## 2017-01-05 ENCOUNTER — Telehealth: Payer: Self-pay | Admitting: Family Medicine

## 2017-01-05 NOTE — Telephone Encounter (Signed)
Please review. Thanks!  

## 2017-01-05 NOTE — Telephone Encounter (Signed)
[employer] called is calling back with determination for approval.  2 request non form exception for visoprolol-hydrocholor thiazide 5-625 mg tab,  Has been approve for 1 yr.  teer exception request for rx Is non form and has been denied.  They will notify pt and written notification to you.  teri

## 2017-02-06 ENCOUNTER — Encounter: Payer: Self-pay | Admitting: Family Medicine

## 2017-02-06 ENCOUNTER — Ambulatory Visit (INDEPENDENT_AMBULATORY_CARE_PROVIDER_SITE_OTHER): Payer: Medicare Other | Admitting: Family Medicine

## 2017-02-06 VITALS — BP 104/64 | HR 60 | Temp 97.0°F | Resp 15 | Wt 122.4 lb

## 2017-02-06 DIAGNOSIS — J301 Allergic rhinitis due to pollen: Secondary | ICD-10-CM | POA: Diagnosis not present

## 2017-02-06 DIAGNOSIS — N3090 Cystitis, unspecified without hematuria: Secondary | ICD-10-CM | POA: Diagnosis not present

## 2017-02-06 DIAGNOSIS — R300 Dysuria: Secondary | ICD-10-CM

## 2017-02-06 DIAGNOSIS — H6123 Impacted cerumen, bilateral: Secondary | ICD-10-CM | POA: Diagnosis not present

## 2017-02-06 LAB — POCT URINALYSIS DIPSTICK
Bilirubin, UA: NEGATIVE
GLUCOSE UA: NEGATIVE
Ketones, UA: NEGATIVE
NITRITE UA: NEGATIVE
Spec Grav, UA: 1.01 (ref 1.010–1.025)
UROBILINOGEN UA: 1 U/dL
pH, UA: 6.5 (ref 5.0–8.0)

## 2017-02-06 MED ORDER — CEPHALEXIN 500 MG PO CAPS: 500.0000 mg | ORAL_CAPSULE | Freq: Two times a day (BID) | ORAL | 0 refills | Status: DC

## 2017-02-06 NOTE — Patient Instructions (Signed)
Start your Flonase spray for allergy symptoms. We will call you with the urine culture results.

## 2017-02-06 NOTE — Progress Notes (Signed)
Subjective:     Patient ID: Gloria Mosley, female   DOB: 1933/06/19, 83 y.o.   MRN: [id number]  HPI  Chief Complaint  Patient presents with  . Ear Pain    Patient comes in office today with complaints of bilateral ear pain and decreased hearing for the past 30 days. Patient states over the past 7 days symptoms have got worse. Associated with ear pain patient complains of; sinus headache, post nasal drip, sore throat, ringing in ears and being hoarse.   Daughter accompanies and states she has also had dysuria for the last month or so. States she is not on any allergy medication at this time.   Review of Systems     Objective:   Physical Exam  Constitutional: She appears well-developed and well-nourished. No distress.  Ears: ear canals with cerumen impaction. After irrigation per Kat: TM's are intact and patient reports improvement in her hearing. Throat: tonsils absent with posterior pharyngeal erythema Neck: no cervical adenopathy Lungs: clear     Assessment:    1. Seasonal allergic rhinitis due to pollen  2. Bilateral impacted cerumen - EAR CERUMEN REMOVAL  3. Dysuria - POCT urinalysis dipstick  4. Cystitis - Urine culture - cephALEXin (KEFLEX) 500 MG capsule; Take 1 capsule (500 mg total) by mouth 2 (two) times daily.  Dispense: 14 capsule; Refill: 0    Plan:    Further f/u pending urine culture. Start steroid nasal spray.

## 2017-02-08 LAB — URINE CULTURE

## 2017-02-17 ENCOUNTER — Telehealth: Payer: Self-pay | Admitting: Family Medicine

## 2017-02-21 ENCOUNTER — Ambulatory Visit (INDEPENDENT_AMBULATORY_CARE_PROVIDER_SITE_OTHER): Payer: Medicare Other | Admitting: Family Medicine

## 2017-02-21 ENCOUNTER — Encounter: Payer: Self-pay | Admitting: Family Medicine

## 2017-02-21 VITALS — BP 130/60 | HR 72 | Temp 97.7°F | Resp 16 | Wt 120.0 lb

## 2017-02-21 DIAGNOSIS — G4700 Insomnia, unspecified: Secondary | ICD-10-CM | POA: Diagnosis not present

## 2017-02-21 DIAGNOSIS — R300 Dysuria: Secondary | ICD-10-CM | POA: Diagnosis not present

## 2017-02-21 DIAGNOSIS — N3001 Acute cystitis with hematuria: Secondary | ICD-10-CM

## 2017-02-21 LAB — POCT URINALYSIS DIPSTICK
BILIRUBIN UA: NEGATIVE
Glucose, UA: NEGATIVE
KETONES UA: NEGATIVE
Nitrite, UA: NEGATIVE
PH UA: 6 (ref 5.0–8.0)
SPEC GRAV UA: 1.015 (ref 1.010–1.025)
Urobilinogen, UA: 0.2 E.U./dL

## 2017-02-21 MED ORDER — LORAZEPAM 0.5 MG PO TABS: 0.2500 mg | ORAL_TABLET | Freq: Every day | ORAL | 1 refills | Status: DC

## 2017-02-21 MED ORDER — CIPROFLOXACIN HCL 500 MG PO TABS: 500.0000 mg | ORAL_TABLET | Freq: Two times a day (BID) | ORAL | 0 refills | Status: AC

## 2017-02-21 NOTE — Progress Notes (Signed)
poct      Patient: Gloria Mosley Female    DOB: December 23, 1932   83 y.o.   MRN: [id number] Visit Date: 02/21/2017  Today's Provider: Mila Merry, MD   Chief Complaint  Patient presents with  . Dysuria   Subjective:    Patient has had burning upon urination for about 3 months. Patient saw Toni Arthurs 02/06/2017 for painful urination. Patient was given Keflex. Patient stated that she is still having burning sensation when she urination.    Dysuria   This is a recurrent problem. The current episode started more than 1 year ago (about 3 months). The problem has been unchanged. The quality of the pain is described as burning. The pain is mild. There has been no fever. Associated symptoms include chills, frequency, nausea and urgency. Pertinent negatives include no discharge, flank pain, hematuria, hesitancy, possible pregnancy, sweats or vomiting. She has tried antibiotics (keflex) for the symptoms. The treatment provided mild relief. Her past medical history is significant for recurrent UTIs.  She was seen by Toni Arthurs, PA-C 02/06/2017 with large blood and moderate leukocytes in urine and was prescribed cephalexin, however urine culture was negative and symptoms never improved on antibiotic. Urine culture 08/02/16 grew E. Faecalis, and 01/07/2016 grew Pseudomonas aeruginosa.   She does have uterine prolapse and using pessary managed by Dr. Feliberto Gottron.  She also reports that she has been waking up at night very confused and seeing people who are not there. She has had more anxiety lately and thinks this may be interfering with her sleep. She denies any trouble with memory, confusion, or any type of hallucination during the daytime when awake.      Allergies  Allergen Reactions  . Sulfa Antibiotics     Other reaction(s): Other (See Comments) Does not tolerate well     Current Outpatient Prescriptions:  .  atorvastatin (LIPITOR) 10 MG tablet, TAKE 1 TABLET EVERY OTHER DAY, Disp: 30 tablet,  Rfl: 11 .  bisoprolol-hydrochlorothiazide (ZIAC) 5-6.25 MG tablet, , Disp: , Rfl: 10 .  Cholecalciferol (VITAMIN D) 2000 units CAPS, Take 1 capsule (2,000 Units total) by mouth daily., Disp: 1 capsule, Rfl: 0 .  fluticasone (FLONASE) 50 MCG/ACT nasal spray, Place 2 sprays into both nostrils daily., Disp: 16 g, Rfl: 6 .  hydrochlorothiazide (HYDRODIURIL) 12.5 MG tablet, Take 1 tablet (12.5 mg total) by mouth daily., Disp: 30 tablet, Rfl: 3 .  metoprolol succinate (TOPROL-XL) 25 MG 24 hr tablet, Take 1 tablet (25 mg total) by mouth daily., Disp: 30 tablet, Rfl: 3 .  raloxifene (EVISTA) 60 MG tablet, TAKE 1 TABLET DAILY, Disp: 30 tablet, Rfl: 12  Review of Systems  Constitutional: Positive for chills. Negative for appetite change, fatigue and fever.  Respiratory: Negative for chest tightness and shortness of breath.   Cardiovascular: Negative for chest pain and palpitations.  Gastrointestinal: Positive for nausea. Negative for abdominal pain and vomiting.  Genitourinary: Positive for dysuria, frequency and urgency. Negative for flank pain, hematuria and hesitancy.  Neurological: Negative for dizziness and weakness.    Social History  Substance Use Topics  . Smoking status: Former Smoker    Packs/day: 1.00    Types: Cigarettes    Quit date: 10/24/1950  . Smokeless tobacco: Never Used  . Alcohol use No   Objective:   BP 130/60 (BP Location: Right Arm, Patient Position: Sitting, Cuff Size: Normal)   Pulse 72   Temp 97.7 F (36.5 C) (Oral)   Resp 16   Wt 120 lb (54.4  kg)   SpO2 98%   BMI 21.26 kg/m     Physical Exam  General Appearance:    Alert, cooperative, no distress  Eyes:    PERRL, conjunctiva/corneas clear, EOM's intact       Lungs:     Clear to auscultation bilaterally, respirations unlabored  Heart:    Regular rate and rhythm  Abdomen:   bowel sounds present and normal in all 4 quadrants, soft or round. No CVA tenderness    Results for orders placed or performed in  visit on 02/21/17  POCT urinalysis dipstick  Result Value Ref Range   Color, UA Yellow    Clarity, UA Slightly Cloudy    Glucose, UA Neg    Bilirubin, UA Neg    Ketones, UA Neg    Spec Grav, UA 1.015 1.010 - 1.025   Blood, UA Large    pH, UA 6.0 5.0 - 8.0   Protein, UA Trace    Urobilinogen, UA 0.2 0.2 or 1.0 E.U./dL   Nitrite, UA Neg    Leukocytes, UA Large (3+) (A) Negative       Assessment & Plan:     1. Dysuria  - POCT urinalysis dipstick - CULTURE, URINE COMPREHENSIVE  2. Acute cystitis with hematuria Was treated for same symptoms 02/06/2017 with cephalexin, but did not improve and follow up urine cultures were negative. Consider urology referral.  - ciprofloxacin (CIPRO) 500 MG tablet; Take 1 tablet (500 mg total) by mouth 2 (two) times daily.  Dispense: 14 tablet; Refill: 0  3. Insomnia, unspecified type  - LORazepam (ATIVAN) 0.5 MG tablet; Take 0.5-1 tablets (0.25-0.5 mg total) by mouth at bedtime.  Dispense: 30 tablet; Refill: 1       Mila Merry, MD  [facility]

## 2017-02-23 LAB — CULTURE, URINE COMPREHENSIVE

## 2017-02-27 ENCOUNTER — Encounter: Payer: Self-pay | Admitting: Family Medicine

## 2017-02-27 ENCOUNTER — Ambulatory Visit (INDEPENDENT_AMBULATORY_CARE_PROVIDER_SITE_OTHER): Payer: Medicare Other | Admitting: Family Medicine

## 2017-02-27 VITALS — BP 132/60 | HR 68 | Temp 97.9°F | Resp 16 | Wt 119.0 lb

## 2017-02-27 DIAGNOSIS — G4700 Insomnia, unspecified: Secondary | ICD-10-CM

## 2017-02-27 DIAGNOSIS — N898 Other specified noninflammatory disorders of vagina: Secondary | ICD-10-CM | POA: Diagnosis not present

## 2017-02-27 DIAGNOSIS — R443 Hallucinations, unspecified: Secondary | ICD-10-CM

## 2017-02-27 MED ORDER — HALOPERIDOL 1 MG PO TABS: 1.0000 mg | ORAL_TABLET | Freq: Every day | ORAL | 1 refills | Status: DC

## 2017-02-27 NOTE — Progress Notes (Signed)
Patient: Gloria OmanJoan Balding Female    DOB: 04/29/1933   83 y.o.   MRN: [id number] Visit Date: 02/27/2017  Today's Provider: Mila Merryonald Fisher, MD   Chief Complaint  Patient presents with  . Insomnia  . Dysuria   Subjective:    HPI Insomnia:  Patient was last seen for this problem 1 week ago and was started on Lorazepam. Patient reports good compliance with treatment, good tolerance and poor symptom conrol. Patient states has not been able to sleep in 3 days. She states she wakes up frightened and scared.  Patient daughter states that patient wakes up at night hearing voices and seeing people who aren't there and that birds are trying to get into house to attack her. She has no such experiences during the daytime, but daughter states she started having some hallucinations last night before going to bed.   Follow up of Dysuria:  Patient was last seen for this problem 1 week ago and was started on a second round of Cipro. Patient reports good compliance with treatment , but states she still has burning during urination.  She is consistently showing leukocytes in urine, but culture on 02/21/17 and 02/06/17. She had 50-100K E.faecalis on 10/10/17and 25-50K Pseudomonas on 01/06/16. She does use a pessary.     Allergies  Allergen Reactions  . Sulfa Antibiotics     Other reaction(s): Other (See Comments) Does not tolerate well     Current Outpatient Prescriptions:  .  atorvastatin (LIPITOR) 10 MG tablet, TAKE 1 TABLET EVERY OTHER DAY, Disp: 30 tablet, Rfl: 11 .  bisoprolol-hydrochlorothiazide (ZIAC) 5-6.25 MG tablet, , Disp: , Rfl: 10 .  Cholecalciferol (VITAMIN D) 2000 units CAPS, Take 1 capsule (2,000 Units total) by mouth daily., Disp: 1 capsule, Rfl: 0 .  ciprofloxacin (CIPRO) 500 MG tablet, Take 1 tablet (500 mg total) by mouth 2 (two) times daily., Disp: 14 tablet, Rfl: 0 .  fluticasone (FLONASE) 50 MCG/ACT nasal spray, Place 2 sprays into both nostrils daily., Disp: 16 g, Rfl: 6 .   hydrochlorothiazide (HYDRODIURIL) 12.5 MG tablet, Take 1 tablet (12.5 mg total) by mouth daily., Disp: 30 tablet, Rfl: 3 .  LORazepam (ATIVAN) 0.5 MG tablet, Take 0.5-1 tablets (0.25-0.5 mg total) by mouth at bedtime., Disp: 30 tablet, Rfl: 1 .  metoprolol succinate (TOPROL-XL) 25 MG 24 hr tablet, Take 1 tablet (25 mg total) by mouth daily., Disp: 30 tablet, Rfl: 3 .  raloxifene (EVISTA) 60 MG tablet, TAKE 1 TABLET DAILY, Disp: 30 tablet, Rfl: 12  Review of Systems  Constitutional: Negative for appetite change, chills, fatigue and fever.  Respiratory: Negative for chest tightness and shortness of breath.   Cardiovascular: Negative for chest pain and palpitations.  Gastrointestinal: Negative for abdominal pain, nausea and vomiting.  Genitourinary: Positive for dysuria.  Neurological: Negative for dizziness and weakness.  Psychiatric/Behavioral: Positive for confusion, decreased concentration and sleep disturbance.    Social History  Substance Use Topics  . Smoking status: Former Smoker    Packs/day: 1.00    Types: Cigarettes    Quit date: 10/24/1950  . Smokeless tobacco: Never Used  . Alcohol use No   Objective:   BP 132/60 (BP Location: Left Arm, Patient Position: Sitting, Cuff Size: Normal)   Pulse 68   Temp 97.9 F (36.6 C) (Oral)   Resp 16   Wt 119 lb (54 kg)   SpO2 98% Comment: room air  BMI 21.08 kg/m  Vitals:   02/27/17 1435  Resp:  16  Weight: 119 lb (54 kg)     Physical Exam   General Appearance:    Alert, cooperative, no distress  Eyes:    PERRL, conjunctiva/corneas clear, EOM's intact       Lungs:     Clear to auscultation bilaterally, respirations unlabored  Heart:    Regular rate and rhythm  Neurologic:   Awake, alert, oriented x 3. No apparent focal neurological           defect. No CVAT      Results for orders placed or performed in visit on 02/21/17  POCT urinalysis dipstick  Result Value Ref Range   Color, UA Yellow    Clarity, UA Slightly Cloudy      Glucose, UA Neg    Bilirubin, UA Neg    Ketones, UA Neg    Spec Grav, UA 1.015 1.010 - 1.025   Blood, UA Large    pH, UA 6.0 5.0 - 8.0   Protein, UA Trace    Urobilinogen, UA 0.2 0.2 or 1.0 E.U./dL   Nitrite, UA Neg    Leukocytes, UA Large (3+) (A) Negative        Assessment & Plan:     1. UTI -Urine Culture - Ambulatory referral to Urology  2. Insomnia, unspecified type   3. Hallucinations Mostly in the middle of night, but having some episodes even before falling asleep at night.  - haloperidol (HALDOL) 1 MG tablet; Take 1 tablet (1 mg total) by mouth at bedtime.  Dispense: 30 tablet; Refill: 1 - Ambulatory referral to Psychiatry       Mila Merry, MD  [facility]

## 2017-02-28 ENCOUNTER — Telehealth: Payer: Self-pay | Admitting: Family Medicine

## 2017-02-28 NOTE — Telephone Encounter (Signed)
Please advise 

## 2017-02-28 NOTE — Telephone Encounter (Signed)
Gloria Mosley from [facility] called stating that they do not treat pt's for diagnosis that is listed in referral (leukorrhea) .Can this be changed or does pt need referral to GYN ?

## 2017-03-01 ENCOUNTER — Telehealth: Payer: Self-pay | Admitting: Family Medicine

## 2017-03-01 DIAGNOSIS — R443 Hallucinations, unspecified: Secondary | ICD-10-CM

## 2017-03-01 MED ORDER — RISPERIDONE 0.5 MG PO TABS: 0.5000 mg | ORAL_TABLET | Freq: Every day | ORAL | 1 refills | Status: DC

## 2017-03-01 NOTE — Telephone Encounter (Signed)
Please advise 

## 2017-03-01 NOTE — Addendum Note (Signed)
Addended by: Marlene LardMILLER, Trayvon Trumbull M on: 03/01/2017 03:29 PM   Modules accepted: Orders

## 2017-03-01 NOTE — Telephone Encounter (Signed)
Patient's daughter Victorino DikeJennifer was notified. Victorino DikeJennifer wanted to try risperidone. Rx sent to pharmacy. Referral is still pending.

## 2017-03-01 NOTE — Telephone Encounter (Signed)
Can change haloperidol to risperidone 0.5mg  [facility]. Please check on status of psychiatry referral.

## 2017-03-01 NOTE — Telephone Encounter (Signed)
Daughter called saying her mom has been hearing voices telling commands around the house.  Hearing voices.  Escessive worry.  Please advise what to do whether take to ER or some back in for an eval. [phone]  Thanks teri

## 2017-03-01 NOTE — Telephone Encounter (Signed)
She is having recurrent UTI and cystitis.

## 2017-03-26 NOTE — Progress Notes (Signed)
03/27/2017 11:43 AM   Gloria Mosley May 11, 1933 [id number]  Referring provider: Birdie Sons, MD [address] [place], [place] [address]  Chief Complaint  Patient presents with  . New Patient (Initial Visit)    dysuria referred by Dr. Caryn Section    HPI: Patient is a 83 -year-old Caucasian female who is referred to [place] by, Dr. Alan Mulder, for recurrent urinary tract infections with her daughter, Anderson Malta.    She has been experiencing dysuria off and on for several months.    Patient states that she has had 6 urinary tract infections over the last year.  Reviewing her records,  she has had one documented UTI for enterococcus faecalis over the last year.    Her symptoms with a urinary tract infection consist of dysuria and suprapubic pain.  She denies gross hematuria, back pain, abdominal pain or flank pain.  She has not had any recent fevers, chills, nausea or vomiting.   She is currently having urgency, slight urge incontinence (wears POISE pad for protection), nocturia x 1.    She does not have a history of nephrolithiasis, GU surgery or GU trauma.   She is not sexually active.    She is postmenopausal.   She does wear a pessary.    She admits to constipation.  She does engage in good perineal hygiene. She does not take tub baths.   She is not having pain with bladder filling.   She has not had any recent imaging studies.    She is drinking 3 bottles of water daily.   She is drinking 3 Coca-Cola daily.   Sweat tea.    Her UA today is positive for 6-10 WBC's, 3-10 RBC's and nitrite positive.  This was a cath specimen.     PMH: Past Medical History:  Diagnosis Date  . Anxiety   . Arthritis   . Hyperlipidemia   . Hypertension   . Osteoporosis     Surgical History: Past Surgical History:  Procedure Laterality Date  . CHOLECYSTECTOMY  1999   Surgeon: Dr. Pat Patrick  . Repair Dupuytren's Contracture  2011   Dr. Tamala Julian    Home Medications:  Allergies as of  03/27/2017      Reactions   Sulfa Antibiotics    Other reaction(s): Other (See Comments) Does not tolerate well      Medication List       Accurate as of 03/27/17 11:43 AM. Always use your most recent med list.          atorvastatin 10 MG tablet Commonly known as:  LIPITOR TAKE 1 TABLET EVERY OTHER DAY   bisoprolol-hydrochlorothiazide 5-6.25 MG tablet Commonly known as:  ZIAC bisoprolol 5 mg-hydrochlorothiazide 6.25 mg tablet   carvedilol 12.5 MG tablet Commonly known as:  COREG carvedilol 12.5 mg tablet   cephALEXin 500 MG capsule Commonly known as:  KEFLEX cephalexin 500 mg capsule   ciprofloxacin 500 MG tablet Commonly known as:  CIPRO ciprofloxacin 500 mg tablet   conjugated estrogens vaginal cream Commonly known as:  PREMARIN Place 1 Applicatorful vaginally daily. Apply 0.73m (pea-sized amount)  just inside the vaginal introitus with a finger-tip every night for two weeks and then Monday, Wednesday and Friday nights.   fluticasone 50 MCG/ACT nasal spray Commonly known as:  FLONASE Place 2 sprays into both nostrils daily.   haloperidol 1 MG tablet Commonly known as:  HALDOL Take 1 tablet (1 mg total) by mouth at bedtime.   hydrochlorothiazide 12.5 MG  tablet Commonly known as:  HYDRODIURIL Take 1 tablet (12.5 mg total) by mouth daily.   LORazepam 0.5 MG tablet Commonly known as:  ATIVAN Take 0.5-1 tablets (0.25-0.5 mg total) by mouth at bedtime.   meloxicam 7.5 MG tablet Commonly known as:  MOBIC meloxicam 7.5 mg tablet  Take 1 tablet every day by oral route with meals.   metoprolol succinate 25 MG 24 hr tablet Commonly known as:  TOPROL-XL Take 1 tablet (25 mg total) by mouth daily.   oxyCODONE 5 MG immediate release tablet Commonly known as:  Oxy IR/ROXICODONE oxycodone 5 mg tablet   raloxifene 60 MG tablet Commonly known as:  EVISTA TAKE 1 TABLET DAILY   risperiDONE 0.5 MG tablet Commonly known as:  RISPERDAL Take 1 tablet (0.5 mg total) by  mouth at bedtime.   Vitamin D 2000 units Caps Take 1 capsule (2,000 Units total) by mouth daily.       Allergies:  Allergies  Allergen Reactions  . Sulfa Antibiotics     Other reaction(s): Other (See Comments) Does not tolerate well    Family History: Family History  Problem Relation Age of Onset  . Diabetes Mother        type 2  . Alzheimer's disease Father   . Lung cancer Sister   . Lung cancer Brother   . Lung cancer Brother   . Lung cancer Brother   . Lung cancer Brother   . Lung cancer Brother   . Kidney cancer Neg Hx   . Bladder Cancer Neg Hx     Social History:  reports that she quit smoking about 66 years ago. Her smoking use included Cigarettes. She smoked 1.00 pack per day. She has never used smokeless tobacco. She reports that she does not drink alcohol or use drugs.  ROS: UROLOGY Frequent Urination?: Yes Hard to postpone urination?: No Burning/pain with urination?: Yes Get up at night to urinate?: Yes Leakage of urine?: Yes Urine stream starts and stops?: No Trouble starting stream?: No Do you have to strain to urinate?: No Blood in urine?: Yes Urinary tract infection?: Yes Sexually transmitted disease?: No Injury to kidneys or bladder?: No Painful intercourse?: No Weak stream?: No Currently pregnant?: No Vaginal bleeding?: No Last menstrual period?: n  Gastrointestinal Nausea?: Yes Vomiting?: No Indigestion/heartburn?: No Diarrhea?: No Constipation?: Yes  Constitutional Fever: No Night sweats?: No Weight loss?: No Fatigue?: No  Skin Skin rash/lesions?: No Itching?: No  Eyes Blurred vision?: Yes Double vision?: No  Ears/Nose/Throat Sore throat?: Yes Sinus problems?: Yes  Hematologic/Lymphatic Swollen glands?: No Easy bruising?: No  Cardiovascular Leg swelling?: No Chest pain?: No  Respiratory Cough?: Yes Shortness of breath?: No  Endocrine Excessive thirst?: No  Musculoskeletal Back pain?: No Joint pain?:  No  Neurological Headaches?: No Dizziness?: Yes  Psychologic Depression?: No Anxiety?: Yes  Physical Exam: BP (!) 165/84   Pulse 62   Ht 5' 2"  (1.575 m)   Wt 119 lb 14.4 oz (54.4 kg)   BMI 21.93 kg/m   Constitutional: Well nourished. Alert and oriented, No acute distress. HEENT: [place] AT, moist mucus membranes. Trachea midline, no masses. Cardiovascular: No clubbing, cyanosis, or edema. Respiratory: Normal respiratory effort, no increased work of breathing. GI: Abdomen is soft, non tender, non distended, no abdominal masses. Liver and spleen not palpable.  No hernias appreciated.  Stool sample for occult testing is not indicated.   GU: No CVA tenderness.  No bladder fullness or masses.  Atrophic external genitalia, normal pubic hair  distribution, no lesions.  No urethral meatus, no lesions, no prolapse, no discharge.   No urethral masses, tenderness and/or tenderness. No bladder fullness, tenderness or masses. Pale vagina mucosa, poor estrogen effect, no discharge, no lesions, good pelvic support, no cystocele or rectocele noted.  Pessary in place.  No cervical motion tenderness.  Uterus is freely mobile and non-fixed.  No adnexal/parametria masses or tenderness noted.  Anus and perineum are without rashes or lesions.    Skin: No rashes, bruises or suspicious lesions. Lymph: No cervical or inguinal adenopathy. Neurologic: Grossly intact, no focal deficits, moving all 4 extremities. Psychiatric: Normal mood and affect.  Laboratory Data: Lab Results  Component Value Date   WBC 7.3 02/14/2015   HGB 12.2 02/14/2015   HCT 36.4 02/14/2015   MCV 91 02/14/2015   PLT 234 02/14/2015    Lab Results  Component Value Date   CREATININE 1.00 08/03/2016       Component Value Date/Time   CHOL 161 08/03/2016 1019   HDL 61 08/03/2016 1019   CHOLHDL 2.6 08/03/2016 1019   LDLCALC 88 08/03/2016 1019    Lab Results  Component Value Date   AST 20 08/03/2016   Lab Results  Component  Value Date   ALT 10 08/03/2016     Urinalysis 6-10 WBC's.  3-10 RBCs.  Nitrite positive.  See EPIC.     Assessment & Plan:    1. Dysuria  - criteria for recurrent UTI has not been met with 2 or more infections in 6 months or 3 or greater infections in one year   - Patient is instructed to increase their water intake until the urine is pale yellow or clear (10 to 12 cups daily) - discontinue soda's  - probiotics (yogurt, oral pills or vaginal suppositories), take cranberry pills or drink the juice and Vitamin C 1,000 mg daily to acidify the urine should be added to their daily regimen   -avoid soaking in tubs and wipe front to back after urinating   - advised them to have CATH UA's for urinalysis and culture to prevent skin contamination of the specimen  - reviewed symptoms of UTI and advised not to have urine checked or be treated for UTI if not experiencing symptoms  - discussed antibiotic stewardship with the patient    - UA was suspicious for infection - sent for culture - will hold on antibiotic until sensitivities are available  2. Vaginal atrophy  - I explained to the patient that when women go through menopause and her estrogen levels are severely diminished, the normal vaginal flora will change.  This is due to an increase of the vaginal canal's pH. Because of this, the vaginal canal may be colonized by bacteria from the rectum instead of the protective lactobacillus.  This accompanied by the loss of the mucus barrier with vaginal atrophy is a cause of recurrent urinary tract infections.  - In some studies, the use of vaginal estrogen cream has been demonstrated to reduce  recurrent urinary tract infections to one a year.   - Patient was given a sample of vaginal estrogen cream (Premarin) and instructed to apply 0.62m (pea-sized amount)  just inside the vaginal introitus with a finger-tip on Monday, Wednesday and Friday nights.  I explained to the patient that vaginally administered  estrogen, which causes only a slight increase in the blood estrogen levels, have fewer contraindications and adverse systemic effects that oral HT.  - She will follow up in three months for  an exam.                                 Return in about 3 months (around 06/27/2017), or exam.  These notes generated with voice recognition software. I apologize for typographical errors.  Zara Council, [facility] [address], [place], [place] [address] [phone]

## 2017-03-27 ENCOUNTER — Ambulatory Visit (INDEPENDENT_AMBULATORY_CARE_PROVIDER_SITE_OTHER): Payer: Medicare Other | Admitting: Urology

## 2017-03-27 ENCOUNTER — Encounter: Payer: Self-pay | Admitting: Urology

## 2017-03-27 VITALS — BP 165/84 | HR 62 | Ht 62.0 in | Wt 119.9 lb

## 2017-03-27 DIAGNOSIS — R300 Dysuria: Secondary | ICD-10-CM

## 2017-03-27 DIAGNOSIS — N952 Postmenopausal atrophic vaginitis: Secondary | ICD-10-CM | POA: Diagnosis not present

## 2017-03-27 LAB — URINALYSIS, COMPLETE
Bilirubin, UA: NEGATIVE
Glucose, UA: NEGATIVE
Ketones, UA: NEGATIVE
Nitrite, UA: POSITIVE — AB
PH UA: 6.5 (ref 5.0–7.5)
PROTEIN UA: NEGATIVE
Specific Gravity, UA: 1.01 (ref 1.005–1.030)
UUROB: 0.2 mg/dL (ref 0.2–1.0)

## 2017-03-27 LAB — MICROSCOPIC EXAMINATION: EPITHELIAL CELLS (NON RENAL): NONE SEEN /HPF (ref 0–10)

## 2017-03-27 MED ORDER — ESTROGENS, CONJUGATED 0.625 MG/GM VA CREA
1.0000 | TOPICAL_CREAM | Freq: Every day | VAGINAL | 12 refills | Status: DC
Start: 2017-03-27 — End: 2017-07-04

## 2017-03-27 NOTE — Progress Notes (Signed)
In and Out Catheterization  Patient is present today for a I & O catheterization due to dysuria. Patient was cleaned and prepped in a sterile fashion with betadine and Lidocaine 2% jelly was instilled into the urethra.  A 14FR cath was inserted no complications were noted , 130ml of urine return was noted, urine was yellow in color. A clean urine sample was collected for Urinalysis. Bladder was drained and catheter was removed with out difficulty.    Preformed by: Dallas Schimkeamona Latanja Lehenbauer CMA

## 2017-03-27 NOTE — Patient Instructions (Signed)

## 2017-03-30 LAB — CULTURE, URINE COMPREHENSIVE

## 2017-04-19 DIAGNOSIS — L929 Granulomatous disorder of the skin and subcutaneous tissue, unspecified: Secondary | ICD-10-CM | POA: Diagnosis not present

## 2017-04-19 DIAGNOSIS — N898 Other specified noninflammatory disorders of vagina: Secondary | ICD-10-CM | POA: Diagnosis not present

## 2017-04-19 DIAGNOSIS — N8189 Other female genital prolapse: Secondary | ICD-10-CM | POA: Diagnosis not present

## 2017-04-26 ENCOUNTER — Other Ambulatory Visit: Payer: Self-pay | Admitting: Family Medicine

## 2017-04-27 ENCOUNTER — Other Ambulatory Visit: Payer: Self-pay | Admitting: Emergency Medicine

## 2017-04-27 NOTE — Telephone Encounter (Signed)
error 

## 2017-05-02 DIAGNOSIS — R441 Visual hallucinations: Secondary | ICD-10-CM | POA: Diagnosis not present

## 2017-05-02 DIAGNOSIS — G479 Sleep disorder, unspecified: Secondary | ICD-10-CM | POA: Diagnosis not present

## 2017-06-07 ENCOUNTER — Encounter: Payer: Self-pay | Admitting: Family Medicine

## 2017-06-07 ENCOUNTER — Ambulatory Visit (HOSPITAL_COMMUNITY): Payer: Medicare Other | Admitting: Psychiatry

## 2017-06-07 ENCOUNTER — Ambulatory Visit (INDEPENDENT_AMBULATORY_CARE_PROVIDER_SITE_OTHER): Payer: Medicare Other | Admitting: Family Medicine

## 2017-06-07 VITALS — BP 154/74 | HR 56 | Temp 97.4°F | Resp 16 | Ht 62.0 in | Wt 121.0 lb

## 2017-06-07 DIAGNOSIS — N3001 Acute cystitis with hematuria: Secondary | ICD-10-CM

## 2017-06-07 DIAGNOSIS — J309 Allergic rhinitis, unspecified: Secondary | ICD-10-CM | POA: Diagnosis not present

## 2017-06-07 DIAGNOSIS — H6983 Other specified disorders of Eustachian tube, bilateral: Secondary | ICD-10-CM | POA: Diagnosis not present

## 2017-06-07 DIAGNOSIS — R300 Dysuria: Secondary | ICD-10-CM

## 2017-06-07 LAB — POCT URINALYSIS DIPSTICK
BILIRUBIN UA: NEGATIVE
Glucose, UA: NEGATIVE
Ketones, UA: NEGATIVE
NITRITE UA: NEGATIVE
PH UA: 6.5 (ref 5.0–8.0)
Spec Grav, UA: 1.01 (ref 1.010–1.025)
UROBILINOGEN UA: 0.2 U/dL

## 2017-06-07 MED ORDER — CIPROFLOXACIN HCL 500 MG PO TABS: 500.0000 mg | ORAL_TABLET | Freq: Two times a day (BID) | ORAL | 0 refills | Status: AC

## 2017-06-07 NOTE — Progress Notes (Signed)
Patient: Gloria Mosley Female    DOB: 1933-09-08   83 y.o.   MRN: [id number] Visit Date: 06/07/2017  Today's Provider: Mila Merryonald Fisher, MD   Chief Complaint  Patient presents with  . Ear Fullness  . Dysuria   Subjective:    Patient stated that her ears feel like their stopped up. Also has ringing in both ears. Patient is also having burning when she voids. Patient stated that burning while voiding has not stopped since she was treated UTI on 02/27/2017.  Pain is staying about the same. No discharge. No fevers or chills. Has been having same nasal drainage. Has prescription for fluticasone but it hasn't helped. Stays a little bit congested. No sinus pain or pressure. No cough.      Allergies  Allergen Reactions  . Sulfa Antibiotics     Other reaction(s): Other (See Comments) Does not tolerate well     Current Outpatient Prescriptions:  .  atorvastatin (LIPITOR) 10 MG tablet, TAKE 1 TABLET EVERY OTHER DAY, Disp: 30 tablet, Rfl: 11 .  bisoprolol-hydrochlorothiazide (ZIAC) 5-6.25 MG tablet, bisoprolol 5 mg-hydrochlorothiazide 6.25 mg tablet, Disp: , Rfl:  .  carvedilol (COREG) 12.5 MG tablet, carvedilol 12.5 mg tablet, Disp: , Rfl:  .  Cholecalciferol (VITAMIN D) 2000 units CAPS, Take 1 capsule (2,000 Units total) by mouth daily., Disp: 1 capsule, Rfl: 0 .  conjugated estrogens (PREMARIN) vaginal cream, Place 1 Applicatorful vaginally daily. Apply 0.5mg  (pea-sized amount)  just inside the vaginal introitus with a finger-tip every night for two weeks and then Monday, Wednesday and Friday nights., Disp: 30 g, Rfl: 12 .  fluticasone (FLONASE) 50 MCG/ACT nasal spray, Place 2 sprays into both nostrils daily., Disp: 16 g, Rfl: 6 .  haloperidol (HALDOL) 1 MG tablet, Take 1 tablet (1 mg total) by mouth at bedtime., Disp: 30 tablet, Rfl: 1 .  hydrochlorothiazide (HYDRODIURIL) 12.5 MG tablet, Take 1 tablet (12.5 mg total) by mouth daily., Disp: 30 tablet, Rfl: 3 .  LORazepam (ATIVAN) 0.5 MG  tablet, Take 0.5-1 tablets (0.25-0.5 mg total) by mouth at bedtime., Disp: 30 tablet, Rfl: 1 .  meloxicam (MOBIC) 7.5 MG tablet, meloxicam 7.5 mg tablet  Take 1 tablet every day by oral route with meals., Disp: , Rfl:  .  metoprolol succinate (TOPROL-XL) 25 MG 24 hr tablet, Take 1 tablet (25 mg total) by mouth daily., Disp: 30 tablet, Rfl: 3 .  oxyCODONE (OXY IR/ROXICODONE) 5 MG immediate release tablet, oxycodone 5 mg tablet, Disp: , Rfl:  .  raloxifene (EVISTA) 60 MG tablet, TAKE 1 TABLET DAILY, Disp: 30 tablet, Rfl: 12 .  risperiDONE (RISPERDAL) 0.5 MG tablet, TAKE ONE TABLET BY MOUTH EVERY NIGHT AT BEDTIME, Disp: 30 tablet, Rfl: 11  Review of Systems  Constitutional: Negative for appetite change, chills, fatigue and fever.  HENT: Positive for tinnitus.   Respiratory: Negative for chest tightness and shortness of breath.   Cardiovascular: Negative for chest pain and palpitations.  Gastrointestinal: Negative for abdominal pain, nausea and vomiting.  Genitourinary: Positive for dysuria.  Neurological: Negative for dizziness and weakness.    Social History  Substance Use Topics  . Smoking status: Former Smoker    Packs/day: 1.00    Types: Cigarettes    Quit date: 10/24/1950  . Smokeless tobacco: Never Used  . Alcohol use No   Objective:   BP (!) 154/74 (BP Location: Right Arm, Patient Position: Sitting, Cuff Size: Normal)   Pulse (!) 56   Temp (!) 97.4  F (36.3 C) (Oral)   Resp 16   Ht 5\' 2"  (1.575 m)   Wt 121 lb (54.9 kg)   SpO2 97%   BMI 22.13 kg/m  Vitals:   06/07/17 1112  BP: (!) 154/74  Pulse: (!) 56  Resp: 16  Temp: (!) 97.4 F (36.3 C)  TempSrc: Oral  SpO2: 97%  Weight: 121 lb (54.9 kg)  Height: 5\' 2"  (1.575 m)     Physical Exam  General Appearance:    Alert, cooperative, no distress  HENT:    TM normal without fluid or infection. No TM fluid noted, sinuses nontender and nasal mucosa congested  Eyes:    PERRL, conjunctiva/corneas clear, EOM's intact         Lungs:     Clear to auscultation bilaterally, respirations unlabored  Heart:    Regular rate and rhythm  Neurologic:   Awake, alert, oriented x 3. No apparent focal neurological           defect.   Abd:    No CVAT.    Results for orders placed or performed in visit on 06/07/17  POCT urinalysis dipstick  Result Value Ref Range   Color, UA Yellow    Clarity, UA Slightly Coudy    Glucose, UA Neg    Bilirubin, UA Neg    Ketones, UA Neg    Spec Grav, UA 1.010 1.010 - 1.025   Blood, UA Large    pH, UA 6.5 5.0 - 8.0   Protein, UA 30+    Urobilinogen, UA 0.2 0.2 or 1.0 E.U./dL   Nitrite, UA Neg    Leukocytes, UA Large (3+) (A) Negative        Assessment & Plan:     1. Dysuria  - POCT urinalysis dipstick - CULTURE, URINE COMPREHENSIVE  2. Eustachian tube dysfunction, bilateral Encourage to ahead and try the fluticasone nasal spray. May also take loratadine or fexofenadine.   3. Acute cystitis with hematuria  - ciprofloxacin (CIPRO) 500 MG tablet; Take 1 tablet (500 mg total) by mouth 2 (two) times daily.  Dispense: 14 tablet; Refill: 0  4. Allergic rhinitis, unspecified seasonality, unspecified trigger        Mila Merry, MD  [facility]

## 2017-06-07 NOTE — Patient Instructions (Signed)
   Start using OTC fluticasone nasal spray every day for sinuses   You can also take OTC Claritin or Allegra

## 2017-06-12 LAB — CULTURE, URINE COMPREHENSIVE

## 2017-06-28 ENCOUNTER — Ambulatory Visit: Payer: Medicare Other | Admitting: Urology

## 2017-07-03 NOTE — Progress Notes (Signed)
07/04/2017 11:29 AM   Gloria Mosley 1933-08-04 [id number]  Referring provider: Birdie Sons, MD [address] [place], [place] [address]  Chief Complaint  Patient presents with  . Dysuria    3 month  follow up  . Vaginal Atrophy    HPI: 83 yo WF with vaginal atrophy and a history of recurrent UTI's who presents for a three month follow up.  Background history Patient is a 73 -year-old Caucasian female who is referred to [place] by, Dr. Alan Mulder, for recurrent urinary tract infections with her daughter, Anderson [place].  She has been experiencing dysuria off and on for several months.  Patient states that she has had 6 urinary tract infections over the last year.  Reviewing her records,  she has had one documented UTI for enterococcus faecalis over the last year.  Her symptoms with a urinary tract infection consist of dysuria and suprapubic pain.  She denies gross hematuria, back pain, abdominal pain or flank pain.  She has not had any recent fevers, chills, nausea or vomiting.  She is currently having urgency, slight urge incontinence (wears POISE pad for protection), nocturia x 1.  She does not have a history of nephrolithiasis, GU surgery or GU trauma.  She is not sexually active.  She is postmenopausal.   She does wear a pessary.  She admits to constipation.  She does engage in good perineal hygiene. She does not take tub baths.  She is not having pain with bladder filling.   She has not had any recent imaging studies.   She is drinking 3 bottles of water daily.   She is drinking 3 Coca-Cola daily.   Sweat tea.  Her UA today is positive for 6-10 WBC's, 3-10 RBC's and nitrite positive.  This was a cath specimen.  Urine culture was negative for infection from that visit.  In the interim, she visited her primary care physician for ear fullness. She was also experiencing dysuria at that time.  An UA was obtained at her doctor's office and sent for culture. It was not a catheterized  specimen. It grew out Klebsiella.  Today, she is experiencing urgency x 0-3, frequency x 4-7, is restricting fluids to avoid visits to the restroom,  is engaging in toilet mapping, incontinence x 0-3 and nocturia x 0-3.  Her PVR is 165 mL.   She is not having gross hematuria, suprapubic pain or dysuria. She is not having fevers, chills, nausea or vomiting.   Her cath specimen for UA is positive 11-30 RBC's.  She is applying the vaginal cream 3 nights weekly. She has not experienced a vaginal rash or irritation.   PMH: Past Medical History:  Diagnosis Date  . Anxiety   . Arthritis   . Hyperlipidemia   . Hypertension   . Osteoporosis     Surgical History: Past Surgical History:  Procedure Laterality Date  . CHOLECYSTECTOMY  1999   Surgeon: Dr. Pat Patrick  . Repair Dupuytren's Contracture  2011   Dr. Tamala Julian    Home Medications:  Allergies as of 07/04/2017      Reactions   Sulfa Antibiotics    Other reaction(s): Other (See Comments) Does not tolerate well      Medication List       Accurate as of 07/04/17 11:29 AM. Always use your most recent med list.          atorvastatin 10 MG tablet Commonly known as:  LIPITOR TAKE 1 TABLET EVERY OTHER DAY  bisoprolol-hydrochlorothiazide 5-6.25 MG tablet Commonly known as:  ZIAC bisoprolol 5 mg-hydrochlorothiazide 6.25 mg tablet   carvedilol 12.5 MG tablet Commonly known as:  COREG carvedilol 12.5 mg tablet   conjugated estrogens vaginal cream Commonly known as:  PREMARIN Place 1 Applicatorful vaginally daily. Apply 0.74m (pea-sized amount)  just inside the vaginal introitus with a finger-tip every night for two weeks and then Monday, Wednesday and Friday nights.   fluticasone 50 MCG/ACT nasal spray Commonly known as:  FLONASE Place 2 sprays into both nostrils daily.   haloperidol 1 MG tablet Commonly known as:  HALDOL Take 1 tablet (1 mg total) by mouth at bedtime.   hydrochlorothiazide 12.5 MG tablet Commonly known as:   HYDRODIURIL Take 1 tablet (12.5 mg total) by mouth daily.   LORazepam 0.5 MG tablet Commonly known as:  ATIVAN Take 0.5-1 tablets (0.25-0.5 mg total) by mouth at bedtime.   meloxicam 7.5 MG tablet Commonly known as:  MOBIC meloxicam 7.5 mg tablet  Take 1 tablet every day by oral route with meals.   metoprolol succinate 25 MG 24 hr tablet Commonly known as:  TOPROL-XL Take 1 tablet (25 mg total) by mouth daily.   oxyCODONE 5 MG immediate release tablet Commonly known as:  Oxy IR/ROXICODONE oxycodone 5 mg tablet   raloxifene 60 MG tablet Commonly known as:  EVISTA TAKE 1 TABLET DAILY   risperiDONE 0.5 MG tablet Commonly known as:  RISPERDAL TAKE ONE TABLET BY MOUTH EVERY NIGHT AT BEDTIME   Vitamin D 2000 units Caps Take 1 capsule (2,000 Units total) by mouth daily.            Discharge Care Instructions        Start     Ordered   07/04/17 0000  Urinalysis, Complete     07/04/17 1046   07/04/17 0000  conjugated estrogens (PREMARIN) vaginal cream  Daily    Question:  Supervising Provider  Answer:  BHollice Espy  07/04/17 1113   07/04/17 0000  CULTURE, URINE COMPREHENSIVE     07/04/17 1118   07/04/17 0000  CT HEMATURIA WORKUP    Question Answer Comment  Reason for Exam (SYMPTOM  OR DIAGNOSIS REQUIRED) Microscopic hematuria   Preferred imaging location? [facility]      07/04/17 1127   07/04/17 0000  BUN+Creat     07/04/17 1127      Allergies:  Allergies  Allergen Reactions  . Sulfa Antibiotics     Other reaction(s): Other (See Comments) Does not tolerate well    Family History: Family History  Problem Relation Age of Onset  . Diabetes Mother        type 2  . Alzheimer's disease Father   . Lung cancer Sister   . Lung cancer Brother   . Lung cancer Brother   . Lung cancer Brother   . Lung cancer Brother   . Lung cancer Brother   . Kidney cancer Neg Hx   . Bladder Cancer Neg Hx     Social History:  reports that she quit smoking about  66 years ago. Her smoking use included Cigarettes. She smoked 1.00 pack per day. She has never used smokeless tobacco. She reports that she does not drink alcohol or use drugs.  ROS: UROLOGY Frequent Urination?: No Hard to postpone urination?: No Burning/pain with urination?: Yes Get up at night to urinate?: Yes Leakage of urine?: Yes Urine stream starts and stops?: No Trouble starting stream?: No Do you have to strain to  urinate?: No Blood in urine?: No Urinary tract infection?: No Sexually transmitted disease?: No Injury to kidneys or bladder?: No Painful intercourse?: No Weak stream?: No Currently pregnant?: No Vaginal bleeding?: No Last menstrual period?: n  Gastrointestinal Nausea?: No Vomiting?: No Indigestion/heartburn?: No Diarrhea?: No Constipation?: No  Constitutional Fever: No Night sweats?: No Weight loss?: Yes Fatigue?: No  Skin Skin rash/lesions?: No Itching?: No  Eyes Blurred vision?: Yes Double vision?: No  Ears/Nose/Throat Sore throat?: No Sinus problems?: Yes  Hematologic/Lymphatic Swollen glands?: No Easy bruising?: No  Cardiovascular Leg swelling?: No Chest pain?: No  Respiratory Cough?: No Shortness of breath?: No  Endocrine Excessive thirst?: Yes  Musculoskeletal Back pain?: No Joint pain?: No  Neurological Headaches?: No Dizziness?: Yes  Psychologic Depression?: No Anxiety?: No  Physical Exam: BP (!) 174/77   Pulse 62   Ht _0  (1.575 m)   Wt 120 lb 12.8 oz (54.8 kg)   BMI 22.09 kg/m   Constitutional: Well nourished. Alert and oriented, No acute distress. HEENT: [place] AT, moist mucus membranes. Trachea midline, no masses. Cardiovascular: No clubbing, cyanosis, or edema. Respiratory: Normal respiratory effort, no increased work of breathing. GI: Abdomen is soft, non tender, non distended, no abdominal masses. Liver and spleen not palpable.  No hernias appreciated.  Stool sample for occult testing is not  indicated.   GU: No CVA tenderness.  No bladder fullness or masses.  Atrophic external genitalia, normal pubic hair distribution, no lesions.  No urethral meatus, no lesions, no prolapse, no discharge.   No urethral masses, tenderness and/or tenderness. No bladder fullness, tenderness or masses. Pale vagina mucosa, poor estrogen effect, no discharge, no lesions, good pelvic support, no cystocele or rectocele noted.  Pessary in place.  Anus and perineum are without rashes or lesions.    Skin: No rashes, bruises or suspicious lesions. Lymph: No cervical or inguinal adenopathy. Neurologic: Grossly intact, no focal deficits, moving all 4 extremities. Psychiatric: Normal mood and affect.  Laboratory Data: Lab Results  Component Value Date   CREATININE 1.00 08/03/2016       Component Value Date/Time   CHOL 161 08/03/2016 1019   HDL 61 08/03/2016 1019   CHOLHDL 2.6 08/03/2016 1019   LDLCALC 88 08/03/2016 1019    Lab Results  Component Value Date   AST 20 08/03/2016   Lab Results  Component Value Date   ALT 10 08/03/2016     Urinalysis 11-30 RBC's.  See EPIC.     Assessment & Plan:    1. Dysuria  - criteria for recurrent UTI has not been met with 2 or more infections in 6 months or 3 or greater infections in one year - CATH UA and culture from our office was negative for infection from last visit  - Patient is instructed to increase their water intake until the urine is pale yellow or clear (10 to 12 cups daily) - discontinue soda's  - probiotics (yogurt, oral pills or vaginal suppositories), take cranberry pills or drink the juice and Vitamin C 1,000 mg daily to acidify the urine should be added to their daily regimen   - avoid soaking in tubs and wipe front to back after urinating   - advised them to have CATH UA's for urinalysis and culture to prevent skin contamination of the specimen  - reviewed symptoms of UTI and advised not to have urine checked or be treated for UTI if not  experiencing symptoms  - discussed antibiotic stewardship with the patient    -  UA was suspicious for infection - sent for culture - will hold on antibiotic until sensitivities are available  2. Vaginal atrophy  - used up the sample of Premarin and did not pick up the prescription - script resent  - She will follow up in three months for an exam.    3. Microscopic hematuria  - I explained to the patient that there are a number of causes that can be associated with blood in the urine, such as stones, UTI's, damage to the urinary tract and/or cancer.  - At this time, I felt that the patient warranted further urologic evaluation.   The AUA guidelines state that a CT urogram is the preferred imaging study to evaluate hematuria.  - I explained to the patient that a contrast material will be injected into a vein and that in rare instances, an allergic reaction can result and may even life threatening   The patient denies any allergies to contrast, iodine and/or seafood and is not taking metformin.  - Her reproductive status is postmenopausal  - Following the imaging study,  I've recommended a cystoscopy. I described how this is performed, typically in an office setting with a flexible cystoscope. We described the risks, benefits, and possible side effects, the most common of which is a minor amount of blood in the urine and/or burning which usually resolves in 24 to 48 hours.    - The patient had the opportunity to ask questions which were answered. Based upon this discussion, the patient is willing to proceed. Therefore, I've ordered: a CT Urogram and cystoscopy.  - The patient will return following all of the above for discussion of the results.   - UA  - Urine culture  - BUN + creatinine                                 Return for CT Urogram report and cystoscopy.  These notes generated with voice recognition software. I apologize for typographical errors.  Zara Council, [facility] [address] [place], [place] [address] [phone]

## 2017-07-04 ENCOUNTER — Encounter: Payer: Self-pay | Admitting: Urology

## 2017-07-04 ENCOUNTER — Ambulatory Visit (INDEPENDENT_AMBULATORY_CARE_PROVIDER_SITE_OTHER): Payer: Medicare Other | Admitting: Urology

## 2017-07-04 VITALS — BP 174/77 | HR 62 | Ht 62.0 in | Wt 120.8 lb

## 2017-07-04 DIAGNOSIS — R3129 Other microscopic hematuria: Secondary | ICD-10-CM

## 2017-07-04 DIAGNOSIS — N952 Postmenopausal atrophic vaginitis: Secondary | ICD-10-CM | POA: Diagnosis not present

## 2017-07-04 DIAGNOSIS — R300 Dysuria: Secondary | ICD-10-CM

## 2017-07-04 LAB — MICROSCOPIC EXAMINATION
Bacteria, UA: NONE SEEN
Epithelial Cells (non renal): NONE SEEN /hpf (ref 0–10)
WBC UA: NONE SEEN /HPF (ref 0–?)

## 2017-07-04 LAB — URINALYSIS, COMPLETE
Bilirubin, UA: NEGATIVE
GLUCOSE, UA: NEGATIVE
KETONES UA: NEGATIVE
Leukocytes, UA: NEGATIVE
NITRITE UA: NEGATIVE
Protein, UA: NEGATIVE
UUROB: 0.2 mg/dL (ref 0.2–1.0)
pH, UA: 6.5 (ref 5.0–7.5)

## 2017-07-04 MED ORDER — ESTROGENS, CONJUGATED 0.625 MG/GM VA CREA: 1.0000 | TOPICAL_CREAM | Freq: Every day | VAGINAL | 12 refills | Status: DC

## 2017-07-04 NOTE — Progress Notes (Signed)
In and Out Catheterization  Patient is present today for a I & O catheterization due to dysuria. Patient was cleaned and prepped in a sterile fashion with betadine and Lidocaine 2% jelly was instilled into the urethra.  A 14FR cath was inserted no complications were noted , 165ml of urine return was noted, urine was yellow in color. A clean urine sample was collected for urinalysis. Bladder was drained and catheter was removed with out difficulty.    Preformed by: Dallas Schimkeamona Williams CMA

## 2017-07-05 LAB — BUN+CREAT
BUN / CREAT RATIO: 11 — AB (ref 12–28)
BUN: 9 mg/dL (ref 8–27)
CREATININE: 0.82 mg/dL (ref 0.57–1.00)
GFR, EST AFRICAN AMERICAN: 77 mL/min/{1.73_m2} (ref >59)
GFR, EST NON AFRICAN AMERICAN: 67 mL/min/{1.73_m2} (ref >59)

## 2017-07-07 ENCOUNTER — Telehealth: Payer: Self-pay

## 2017-07-07 LAB — CULTURE, URINE COMPREHENSIVE

## 2017-07-07 NOTE — Telephone Encounter (Signed)
Patient notified on vmail 

## 2017-07-07 NOTE — Telephone Encounter (Signed)
-----   Message from Harle Battiest, PA-C sent at 07/07/2017 11:47 AM EDT ----- Please let Mrs. Taves know that her urine culture was negative.

## 2017-07-14 ENCOUNTER — Ambulatory Visit
Admission: RE | Admit: 2017-07-14 | Discharge: 2017-07-14 | Disposition: A | Discharge status: Home / Self Care | Payer: Medicare Other | Source: Ambulatory Visit | Attending: Urology | Admitting: Urology

## 2017-07-14 DIAGNOSIS — R3129 Other microscopic hematuria: Secondary | ICD-10-CM | POA: Diagnosis present

## 2017-07-14 DIAGNOSIS — N131 Hydronephrosis with ureteral stricture, not elsewhere classified: Secondary | ICD-10-CM | POA: Insufficient documentation

## 2017-07-14 DIAGNOSIS — I700 Atherosclerosis of aorta: Secondary | ICD-10-CM | POA: Diagnosis not present

## 2017-07-14 DIAGNOSIS — N390 Urinary tract infection, site not specified: Secondary | ICD-10-CM | POA: Diagnosis not present

## 2017-07-14 MED ORDER — IOPAMIDOL (ISOVUE-300) INJECTION 61%
100.0000 mL | Freq: Once | INTRAVENOUS | Status: AC | PRN
  Administered 2017-07-14: 100 mL via INTRAVENOUS

## 2017-07-25 ENCOUNTER — Encounter: Payer: Self-pay | Admitting: Urology

## 2017-07-25 ENCOUNTER — Other Ambulatory Visit: Payer: Medicare Other

## 2017-07-25 ENCOUNTER — Ambulatory Visit: Payer: Medicare Other | Admitting: Urology

## 2017-07-25 VITALS — BP 168/76 | HR 59 | Ht 62.0 in | Wt 121.7 lb

## 2017-07-25 DIAGNOSIS — R300 Dysuria: Secondary | ICD-10-CM | POA: Diagnosis not present

## 2017-07-25 DIAGNOSIS — R3129 Other microscopic hematuria: Secondary | ICD-10-CM

## 2017-07-25 LAB — MICROSCOPIC EXAMINATION

## 2017-07-25 LAB — URINALYSIS, COMPLETE
Bilirubin, UA: NEGATIVE
Glucose, UA: NEGATIVE
Ketones, UA: NEGATIVE
Nitrite, UA: NEGATIVE
PH UA: 6.5 (ref 5.0–7.5)
Specific Gravity, UA: 1.01 (ref 1.005–1.030)
UUROB: 0.2 mg/dL (ref 0.2–1.0)

## 2017-07-25 MED ORDER — CIPROFLOXACIN HCL 500 MG PO TABS
500.0000 mg | ORAL_TABLET | Freq: Once | ORAL | Status: AC
  Administered 2017-07-25: 500 mg via ORAL

## 2017-07-25 MED ORDER — LIDOCAINE HCL 2 % EX GEL
1.0000 "application " | Freq: Once | CUTANEOUS | Status: AC
  Administered 2017-07-25: 1 via URETHRAL

## 2017-07-25 NOTE — Progress Notes (Signed)
   07/25/17  CC:  Chief Complaint  Patient presents with  . Cysto    HPI:  Follow-up microscopic hematuria evaluation. She underwent a CT scan 07/14/2017. This revealed the right UPJ obstruction but she did have good drainage of contrast. Other significant finding was a thickened endometrium and gynecology/transvaginal ultrasound referral is recommended. She's here today for exam and cystoscopy. A UA shows a few bacteria, 3-10 red cells and 11-30 white cells. Her last urine culture was negative.  Blood pressure (!) 168/76, pulse (!) 59, height  (1.575 m), weight 55.2 kg (121 lb 11.2 oz). NED. A&Ox3.   No respiratory distress   Abd soft, NT, ND Normal external genitalia with patent urethral meatus; good support with pessary   Cystoscopy Procedure Note  Patient identification was confirmed, informed consent was obtained, and patient was prepped using Betadine solution.  Lidocaine jelly was administered per urethral meatus.    Preoperative abx where received prior to procedure. Lyla Son was chaperone.   Procedure: - Flexible cystoscope introduced, without any difficulty.   - Thorough search of the bladder revealed:    normal urethral meatus    normal urothelium    no stones    no ulcers     no tumors    no urethral polyps    no trabeculation  - Ureteral orifices were normal in position and appearance.  Post-Procedure: - Patient tolerated the procedure well  Assessment/ Plan:  Microscopic hematuria - benign GU eval. Discussed the right UPJ and the thickened endometrium with patient and her daughter and importance of f/u with her Gyn for evaluation. She has an appt with her Gyn in 1 mo.    Jerilee Field, MD

## 2017-07-31 ENCOUNTER — Other Ambulatory Visit: Payer: Self-pay | Admitting: Family Medicine

## 2017-08-15 ENCOUNTER — Other Ambulatory Visit: Payer: Medicare Other | Admitting: Urology

## 2017-08-16 ENCOUNTER — Ambulatory Visit (INDEPENDENT_AMBULATORY_CARE_PROVIDER_SITE_OTHER): Payer: Medicare Other

## 2017-08-16 DIAGNOSIS — N898 Other specified noninflammatory disorders of vagina: Secondary | ICD-10-CM | POA: Diagnosis not present

## 2017-08-16 DIAGNOSIS — Z23 Encounter for immunization: Secondary | ICD-10-CM

## 2017-08-16 DIAGNOSIS — N8189 Other female genital prolapse: Secondary | ICD-10-CM | POA: Diagnosis not present

## 2017-10-17 ENCOUNTER — Other Ambulatory Visit: Payer: Self-pay | Admitting: Family Medicine

## 2017-11-03 DIAGNOSIS — G479 Sleep disorder, unspecified: Secondary | ICD-10-CM | POA: Diagnosis not present

## 2017-11-03 DIAGNOSIS — R413 Other amnesia: Secondary | ICD-10-CM | POA: Diagnosis not present

## 2017-11-03 DIAGNOSIS — R441 Visual hallucinations: Secondary | ICD-10-CM | POA: Diagnosis not present

## 2017-11-21 ENCOUNTER — Other Ambulatory Visit: Payer: Self-pay | Admitting: Family Medicine

## 2018-01-16 DIAGNOSIS — N898 Other specified noninflammatory disorders of vagina: Secondary | ICD-10-CM | POA: Diagnosis not present

## 2018-01-16 DIAGNOSIS — N8189 Other female genital prolapse: Secondary | ICD-10-CM | POA: Diagnosis not present

## 2018-01-23 NOTE — Progress Notes (Deleted)
01/24/2018 10:05 PM   Gloria Mosley [birth year] [id number]  Referring provider: Birdie Sons, [place] [place], [place] [address]  No chief complaint on file.   HPI: 84 yo WF with a history of hematuria, vaginal atrophy and a history of recurrent UTI's who presents for a six month follow up.  History of hematuria Patient completed a hematuria work up with CTU and cystoscopy.  Findings were positive for right UPJ obstruction.  She has not had any episodes of gross hematuria.   Vaginal atrophy She is applying the vaginal cream 3 nights weekly.  History of recurrent UTIs Personal history of urinary tract infections.     PMH: Past Medical History:  Diagnosis Date  . Anxiety   . Arthritis   . Hyperlipidemia   . Hypertension   . Osteoporosis     Surgical History: Past Surgical History:  Procedure Laterality Date  . CHOLECYSTECTOMY  1999   Surgeon: Dr. Pat Patrick  . Repair Dupuytren's Contracture  2011   Dr. Tamala Julian    Home Medications:  Allergies as of 01/24/2018      Reactions   Sulfa Antibiotics    Other reaction(s): Other (See Comments) Does not tolerate well      Medication List        Accurate as of 01/23/18 10:05 PM. Always use your most recent med list.          atorvastatin 10 MG tablet Commonly known as:  LIPITOR TAKE 1 TABLET EVERY OTHER DAY   bisoprolol-hydrochlorothiazide 5-6.25 MG tablet Commonly known as:  ZIAC TAKE 1 TABLET BY MOUTH EVERY DAY   carvedilol 12.5 MG tablet Commonly known as:  COREG carvedilol 12.5 mg tablet   conjugated estrogens vaginal cream Commonly known as:  PREMARIN Place 1 Applicatorful vaginally daily. Apply 0.60m (pea-sized amount)  just inside the vaginal introitus with a finger-tip every night for two weeks and then Monday, Wednesday and Friday nights.   fluticasone 50 MCG/ACT nasal spray Commonly known as:  FLONASE Place 2 sprays into both nostrils daily.   haloperidol 1 MG tablet Commonly  known as:  HALDOL Take 1 tablet (1 mg total) by mouth at bedtime.   hydrochlorothiazide 12.5 MG tablet Commonly known as:  HYDRODIURIL Take 1 tablet (12.5 mg total) by mouth daily.   LORazepam 0.5 MG tablet Commonly known as:  ATIVAN Take 0.5-1 tablets (0.25-0.5 mg total) by mouth at bedtime.   meloxicam 7.5 MG tablet Commonly known as:  MOBIC meloxicam 7.5 mg tablet  Take 1 tablet every day by oral route with meals.   metoprolol succinate 25 MG 24 hr tablet Commonly known as:  TOPROL-XL Take 1 tablet (25 mg total) by mouth daily.   oxyCODONE 5 MG immediate release tablet Commonly known as:  Oxy IR/ROXICODONE oxycodone 5 mg tablet   raloxifene 60 MG tablet Commonly known as:  EVISTA TAKE 1 TABLET DAILY   risperiDONE 0.5 MG tablet Commonly known as:  RISPERDAL TAKE ONE TABLET BY MOUTH EVERY NIGHT AT BEDTIME   Vitamin D 2000 units Caps Take 1 capsule (2,000 Units total) by mouth daily.       Allergies:  Allergies  Allergen Reactions  . Sulfa Antibiotics     Other reaction(s): Other (See Comments) Does not tolerate well    Family History: Family History  Problem Relation Age of Onset  . Diabetes Mother        type 2  . Alzheimer's disease Father   . Lung  cancer Sister   . Lung cancer Brother   . Lung cancer Brother   . Lung cancer Brother   . Lung cancer Brother   . Lung cancer Brother   . Kidney cancer Neg Hx   . Bladder Cancer Neg Hx     Social History:  reports that she quit smoking about 67 years ago. Her smoking use included cigarettes. She smoked 1.00 pack per day. She has never used smokeless tobacco. She reports that she does not drink alcohol or use drugs.  ROS:                                        Physical Exam: There were no vitals taken for this visit.  Constitutional: Well nourished. Alert and oriented, No acute distress. HEENT: [place] AT, moist mucus membranes. Trachea midline, no masses. Cardiovascular: No  clubbing, cyanosis, or edema. Respiratory: Normal respiratory effort, no increased work of breathing. GI: Abdomen is soft, non tender, non distended, no abdominal masses. Liver and spleen not palpable.  No hernias appreciated.  Stool sample for occult testing is not indicated.   GU: No CVA tenderness.  No bladder fullness or masses.  Atrophic external genitalia, normal pubic hair distribution, no lesions.  Normal urethral meatus, no lesions, no prolapse, no discharge.   No urethral masses, tenderness and/or tenderness. No bladder fullness, tenderness or masses. Pale vagina mucosa, poor estrogen effect, no discharge, no lesions, good pelvic support, no cystocele or rectocele noted.  Pessary in place.  No cervical motion tenderness.  Uterus is freely mobile and non-fixed.  No adnexal/parametria masses or tenderness noted.  Anus and perineum are without rashes or lesions.    Skin: No rashes, bruises or suspicious lesions. Lymph: No cervical or inguinal adenopathy. Neurologic: Grossly intact, no focal deficits, moving all 4 extremities. Psychiatric: Normal mood and affect.   Laboratory Data: Lab Results  Component Value Date   CREATININE 0.82 07/04/2017       Component Value Date/Time   CHOL 161 08/03/2016 1019   HDL 61 08/03/2016 1019   CHOLHDL 2.6 08/03/2016 1019   LDLCALC 88 08/03/2016 1019    Lab Results  Component Value Date   AST 20 08/03/2016   Lab Results  Component Value Date   ALT 10 08/03/2016     Urinalysis ***  See EPIC.     Assessment & Plan:    1. Dysuria  - criteria for recurrent UTI has not been met with 2 or more infections in 6 months or 3 or greater infections in one year - CATH UA and culture from our office was negative for infection from last visit  - Patient is instructed to increase their water intake until the urine is pale yellow or clear (10 to 12 cups daily) - discontinue soda's  - probiotics (yogurt, oral pills or vaginal suppositories), take  cranberry pills or drink the juice and Vitamin C 1,000 mg daily to acidify the urine should be added to their daily regimen   - avoid soaking in tubs and wipe front to back after urinating   - advised them to have CATH UA's for urinalysis and culture to prevent skin contamination of the specimen  - reviewed symptoms of UTI and advised not to have urine checked or be treated for UTI if not experiencing symptoms  - discussed antibiotic stewardship with the patient    - UA was  suspicious for infection - sent for culture - will hold on antibiotic until sensitivities are available  2. Vaginal atrophy  - used up the sample of Premarin and did not pick up the prescription - script resent  - She will follow up in three months for an exam.    3. Microscopic hematuria  - I explained to the patient that there are a number of causes that can be associated with blood in the urine, such as stones, UTI's, damage to the urinary tract and/or cancer.  - At this time, I felt that the patient warranted further urologic evaluation.   The AUA guidelines state that a CT urogram is the preferred imaging study to evaluate hematuria.  - I explained to the patient that a contrast material will be injected into a vein and that in rare instances, an allergic reaction can result and may even life threatening   The patient denies any allergies to contrast, iodine and/or seafood and is not taking metformin.  - Her reproductive status is postmenopausal  - Following the imaging study,  I've recommended a cystoscopy. I described how this is performed, typically in an office setting with a flexible cystoscope. We described the risks, benefits, and possible side effects, the most common of which is a minor amount of blood in the urine and/or burning which usually resolves in 24 to 48 hours.    - The patient had the opportunity to ask questions which were answered. Based upon this discussion, the patient is willing to proceed.  Therefore, I've ordered: a CT Urogram and cystoscopy.  - The patient will return following all of the above for discussion of the results.   - UA  - Urine culture  - BUN + creatinine                                 No follow-ups on file.  These notes generated with voice recognition software. I apologize for typographical errors.  Zara Council, [facility] [address], [place],  [address] [phone]

## 2018-01-24 ENCOUNTER — Ambulatory Visit: Payer: Medicare Other | Admitting: Urology

## 2018-01-24 ENCOUNTER — Encounter: Payer: Self-pay | Admitting: Urology

## 2018-02-07 ENCOUNTER — Telehealth: Payer: Self-pay | Admitting: Urology

## 2018-02-07 NOTE — Telephone Encounter (Signed)
FYI -- [facility] stating that she received a no show letter for Gloria Mosley, wanted to reaffirm that she called a couple days prior to the appt stating that Gloria Mosley would not be at that appt, (cancel appt) and that Gloria Mosley will call back to reschedule when available to do so.  No reason given as to why pt was not able to make appt.

## 2018-03-20 ENCOUNTER — Ambulatory Visit: Payer: Medicare Other | Admitting: Family Medicine

## 2018-03-20 ENCOUNTER — Encounter: Payer: Self-pay | Admitting: Family Medicine

## 2018-03-20 VITALS — BP 122/62 | HR 68 | Temp 98.5°F | Resp 16 | Wt 117.0 lb

## 2018-03-20 DIAGNOSIS — R42 Dizziness and giddiness: Secondary | ICD-10-CM

## 2018-03-20 DIAGNOSIS — J069 Acute upper respiratory infection, unspecified: Secondary | ICD-10-CM

## 2018-03-20 DIAGNOSIS — R0600 Dyspnea, unspecified: Secondary | ICD-10-CM

## 2018-03-20 DIAGNOSIS — R0609 Other forms of dyspnea: Secondary | ICD-10-CM

## 2018-03-20 DIAGNOSIS — R5383 Other fatigue: Secondary | ICD-10-CM | POA: Diagnosis not present

## 2018-03-20 DIAGNOSIS — J301 Allergic rhinitis due to pollen: Secondary | ICD-10-CM

## 2018-03-20 MED ORDER — AZITHROMYCIN 250 MG PO TABS: ORAL_TABLET | ORAL | 0 refills | Status: AC

## 2018-03-20 NOTE — Patient Instructions (Signed)
Start taking OTC Allegra (fexofenadine)  once a d for allergies

## 2018-03-20 NOTE — Progress Notes (Signed)
Patient: Gloria Mosley Female    DOB: [birth year]   84 y.o.   MRN: [id number] Visit Date: 03/20/2018  Today's Provider: Mila Merry, MD   Chief Complaint  Patient presents with  . Ear Pain    x 5 days   Subjective:    Otalgia   There is pain in both ears. This is a new problem. Episode onset: 5 days ago. The problem has been waxing and waning. Associated symptoms include coughing, diarrhea (resolved), headaches, hearing loss and rhinorrhea. Pertinent negatives include no abdominal pain, ear discharge, neck pain, sore throat or vomiting. She has tried nothing for the symptoms.  She states she has felt very fatigued and weak for the last couple of weeks associated with occasional episodes of shortness of breath. Has had a lot of nasal discharge and congestion. Feels off balance, sometimes with spinning sensations. Having difficulty hearing.      Allergies  Allergen Reactions  . Sulfa Antibiotics     Other reaction(s): Other (See Comments) Does not tolerate well     Current Outpatient Medications:  .  atorvastatin (LIPITOR) 10 MG tablet, TAKE 1 TABLET EVERY OTHER DAY, Disp: 30 tablet, Rfl: 11 .  bisoprolol-hydrochlorothiazide (ZIAC) 5-6.25 MG tablet, TAKE 1 TABLET BY MOUTH EVERY DAY, Disp: 30 tablet, Rfl: 11 .  carvedilol (COREG) 12.5 MG tablet, carvedilol 12.5 mg tablet, Disp: , Rfl:  .  Cholecalciferol (VITAMIN D) 2000 units CAPS, Take 1 capsule (2,000 Units total) by mouth daily., Disp: 1 capsule, Rfl: 0 .  conjugated estrogens (PREMARIN) vaginal cream, Place 1 Applicatorful vaginally daily. Apply 0.5mg  (pea-sized amount)  just inside the vaginal introitus with a finger-tip every night for two weeks and then Monday, Wednesday and Friday nights., Disp: 30 g, Rfl: 12 .  fluticasone (FLONASE) 50 MCG/ACT nasal spray, Place 2 sprays into both nostrils daily., Disp: 16 g, Rfl: 6 .  haloperidol (HALDOL) 1 MG tablet, Take 1 tablet (1 mg total) by mouth at bedtime., Disp: 30 tablet,  Rfl: 1 .  hydrochlorothiazide (HYDRODIURIL) 12.5 MG tablet, Take 1 tablet (12.5 mg total) by mouth daily., Disp: 30 tablet, Rfl: 3 .  LORazepam (ATIVAN) 0.5 MG tablet, Take 0.5-1 tablets (0.25-0.5 mg total) by mouth at bedtime., Disp: 30 tablet, Rfl: 1 .  meloxicam (MOBIC) 7.5 MG tablet, meloxicam 7.5 mg tablet  Take 1 tablet every day by oral route with meals., Disp: , Rfl:  .  metoprolol succinate (TOPROL-XL) 25 MG 24 hr tablet, Take 1 tablet (25 mg total) by mouth daily., Disp: 30 tablet, Rfl: 3 .  oxyCODONE (OXY IR/ROXICODONE) 5 MG immediate release tablet, oxycodone 5 mg tablet, Disp: , Rfl:  .  raloxifene (EVISTA) 60 MG tablet, TAKE 1 TABLET DAILY, Disp: 30 tablet, Rfl: 11 .  risperiDONE (RISPERDAL) 0.5 MG tablet, TAKE ONE TABLET BY MOUTH EVERY NIGHT AT BEDTIME, Disp: 30 tablet, Rfl: 11  Review of Systems  Constitutional: Negative for appetite change, chills, fatigue and fever.  HENT: Positive for congestion (both ears), ear pain (burning pain in both ears), hearing loss and rhinorrhea. Negative for ear discharge and sore throat.   Respiratory: Positive for cough. Negative for chest tightness and shortness of breath.   Cardiovascular: Negative for chest pain and palpitations.  Gastrointestinal: Positive for diarrhea (resolved). Negative for abdominal pain, nausea and vomiting.  Musculoskeletal: Negative for neck pain.  Neurological: Positive for dizziness, light-headedness and headaches. Negative for weakness.       Feels off balanced when walking  Social History   Tobacco Use  . Smoking status: Former Smoker    Packs/day: 1.00    Types: Cigarettes    Last attempt to quit: 10/24/1950    Years since quitting: 67.4  . Smokeless tobacco: Never Used  Substance Use Topics  . Alcohol use: No    Alcohol/week: 0.0 oz   Objective:   BP 122/62 (BP Location: Left Arm, Patient Position: Sitting, Cuff Size: Normal)   Pulse 68   Temp 98.5 F (36.9 C) (Oral)   Resp 16   Wt 117 lb (53.1  kg)   SpO2 97% Comment: room air  BMI 21.40 kg/m  Vitals:   03/20/18 0813  BP: 122/62  Pulse: 68  Resp: 16  Temp: 98.5 F (36.9 C)  TempSrc: Oral  SpO2: 97%  Weight: 117 lb (53.1 kg)     Physical Exam  General Appearance:    Alert, cooperative, no distress  HENT:   bilateral TM normal without fluid or infection, neck without nodes, throat normal without erythema or exudate, sinuses nontender and nasal mucosa pale and congested  Eyes:    PERRL, conjunctiva/corneas clear, EOM's intact       Lungs:     Clear to auscultation bilaterally, respirations unlabored  Heart:    Regular rate and rhythm  Neurologic:   Awake, alert, oriented x 3. No apparent focal neurological           defect.           Assessment & Plan:     1. Dyspnea on exertion  - CBC - Brain natriuretic peptide  2. Other fatigue - Comprehensive metabolic panel - TSH - Brain natriuretic peptide  3. Dizziness  - Brain natriuretic peptide  4. Upper respiratory tract infection, unspecified type Likely mild sinusitis or eustachian tube dysfunctions. Start azithromycin and OTC fexofenadine. As below  5. Allergic rhinitis due to pollen, unspecified seasonality Start OTC fexofenadine.        Mila Merry, MD  [facility]

## 2018-03-21 LAB — COMPREHENSIVE METABOLIC PANEL
A/G RATIO: 1.2 (ref 1.2–2.2)
ALT: 9 IU/L (ref 0–32)
AST: 13 IU/L (ref 0–40)
Albumin: 3.7 g/dL (ref 3.5–4.7)
Alkaline Phosphatase: 57 IU/L (ref 39–117)
BILIRUBIN TOTAL: 0.6 mg/dL (ref 0.0–1.2)
BUN/Creatinine Ratio: 8 — ABNORMAL LOW (ref 12–28)
BUN: 8 mg/dL (ref 8–27)
CHLORIDE: 94 mmol/L — AB (ref 96–106)
CO2: 29 mmol/L (ref 20–29)
Calcium: 9.1 mg/dL (ref 8.7–10.3)
Creatinine, Ser: 0.96 mg/dL (ref 0.57–1.00)
GFR calc non Af Amer: 55 mL/min/{1.73_m2} — ABNORMAL LOW (ref >59)
GFR, EST AFRICAN AMERICAN: 63 mL/min/{1.73_m2} (ref >59)
Globulin, Total: 3.2 g/dL (ref 1.5–4.5)
Glucose: 86 mg/dL (ref 65–99)
POTASSIUM: 3.2 mmol/L — AB (ref 3.5–5.2)
Sodium: 137 mmol/L (ref 134–144)
Total Protein: 6.9 g/dL (ref 6.0–8.5)

## 2018-03-21 LAB — BRAIN NATRIURETIC PEPTIDE: BNP: 152.9 pg/mL — ABNORMAL HIGH (ref 0.0–100.0)

## 2018-03-21 LAB — TSH: TSH: 2.41 u[IU]/mL (ref 0.450–4.500)

## 2018-03-21 LAB — CBC
Hematocrit: 33.7 % — ABNORMAL LOW (ref 34.0–46.6)
Hemoglobin: 11.2 g/dL (ref 11.1–15.9)
MCH: 29.2 pg (ref 26.6–33.0)
MCHC: 33.2 g/dL (ref 31.5–35.7)
MCV: 88 fL (ref 79–97)
PLATELETS: 219 10*3/uL (ref 150–450)
RBC: 3.84 x10E6/uL (ref 3.77–5.28)
RDW: 13.8 % (ref 12.3–15.4)
WBC: 6.4 10*3/uL (ref 3.4–10.8)

## 2018-03-22 ENCOUNTER — Telehealth: Payer: Self-pay | Admitting: *Deleted

## 2018-03-22 ENCOUNTER — Encounter: Payer: Self-pay | Admitting: Family Medicine

## 2018-03-22 DIAGNOSIS — R0600 Dyspnea, unspecified: Secondary | ICD-10-CM

## 2018-03-22 DIAGNOSIS — R0609 Other forms of dyspnea: Principal | ICD-10-CM

## 2018-03-22 DIAGNOSIS — R7989 Other specified abnormal findings of blood chemistry: Secondary | ICD-10-CM

## 2018-03-22 NOTE — Telephone Encounter (Signed)
Patient's daughter Victorino Dike was notified of results. Expressed understanding. Agreeable to echocardiogram.

## 2018-03-22 NOTE — Telephone Encounter (Signed)
-----   Message from Malva Limes, MD sent at 03/22/2018  7:59 AM EDT ----- Elevated BNP can be indication of strain on heart and could cause fatigue and shortness of breath. Need to order echocardiogram for further evaluation.  Rest of labs are normal.

## 2018-03-29 ENCOUNTER — Telehealth: Payer: Self-pay | Admitting: Family Medicine

## 2018-03-29 NOTE — Telephone Encounter (Signed)
[place] with [employer] called back to obtain more information for the prior auth and gave authorization. This matter no longer requires a peer to peer review.  Authorization# [id number]. Central Scheduling was contacted [phone], no answer. LMTCB. Thanks TNP

## 2018-03-29 NOTE — Telephone Encounter (Signed)
Please advise 

## 2018-03-29 NOTE — Telephone Encounter (Signed)
[employer] was contacted to obtain prior authorization for Echocardiogram Complete that was order on 03/22/18. [employer] advised that it didn't meant standards and requires a peer to peer review. Please call [phone] for the peer to peer review. Please advise. Thanks TNP

## 2018-03-30 NOTE — Telephone Encounter (Signed)
Contacted Victorino DikeJennifer pt's daughter to advise pt is scheduled for Echocardiogram Complete Tuesday 04/03/18 @ 11 am to arrive @ 10:45 am [facility]. Lamont SnowballGave Jennifer scheduling [phone] if she needs to change the appt. Thanks TNP

## 2018-03-30 NOTE — Telephone Encounter (Signed)
Contacted Central Scheduling @ [phone]. No answer LMTCB. Thanks TNP

## 2018-04-03 ENCOUNTER — Ambulatory Visit
Admission: RE | Admit: 2018-04-03 | Discharge: 2018-04-03 | Disposition: A | Discharge status: Home / Self Care | Payer: Medicare Other | Source: Ambulatory Visit | Attending: Family Medicine | Admitting: Family Medicine

## 2018-04-03 DIAGNOSIS — R0609 Other forms of dyspnea: Secondary | ICD-10-CM | POA: Insufficient documentation

## 2018-04-03 DIAGNOSIS — E785 Hyperlipidemia, unspecified: Secondary | ICD-10-CM | POA: Insufficient documentation

## 2018-04-03 DIAGNOSIS — I340 Nonrheumatic mitral (valve) insufficiency: Secondary | ICD-10-CM | POA: Insufficient documentation

## 2018-04-03 DIAGNOSIS — R7989 Other specified abnormal findings of blood chemistry: Secondary | ICD-10-CM | POA: Insufficient documentation

## 2018-04-03 DIAGNOSIS — I10 Essential (primary) hypertension: Secondary | ICD-10-CM | POA: Diagnosis not present

## 2018-04-03 DIAGNOSIS — R0600 Dyspnea, unspecified: Secondary | ICD-10-CM

## 2018-04-03 DIAGNOSIS — F419 Anxiety disorder, unspecified: Secondary | ICD-10-CM | POA: Diagnosis not present

## 2018-04-03 NOTE — Progress Notes (Signed)
*  PRELIMINARY RESULTS* Echocardiogram 2D Echocardiogram has been performed.  Cristela BlueHege, Doniqua Saxby 04/03/2018, 11:54 AM

## 2018-04-18 DIAGNOSIS — Z4689 Encounter for fitting and adjustment of other specified devices: Secondary | ICD-10-CM | POA: Diagnosis not present

## 2018-04-18 DIAGNOSIS — N898 Other specified noninflammatory disorders of vagina: Secondary | ICD-10-CM | POA: Diagnosis not present

## 2018-05-02 DIAGNOSIS — R413 Other amnesia: Secondary | ICD-10-CM | POA: Insufficient documentation

## 2018-05-02 DIAGNOSIS — R443 Hallucinations, unspecified: Secondary | ICD-10-CM | POA: Diagnosis not present

## 2018-05-02 DIAGNOSIS — G479 Sleep disorder, unspecified: Secondary | ICD-10-CM | POA: Diagnosis not present

## 2018-07-18 DIAGNOSIS — Z4689 Encounter for fitting and adjustment of other specified devices: Secondary | ICD-10-CM | POA: Diagnosis not present

## 2018-07-18 DIAGNOSIS — N898 Other specified noninflammatory disorders of vagina: Secondary | ICD-10-CM | POA: Diagnosis not present

## 2018-08-29 ENCOUNTER — Ambulatory Visit (INDEPENDENT_AMBULATORY_CARE_PROVIDER_SITE_OTHER): Payer: Medicare Other

## 2018-08-29 DIAGNOSIS — Z23 Encounter for immunization: Secondary | ICD-10-CM

## 2018-10-31 ENCOUNTER — Other Ambulatory Visit: Payer: Self-pay | Admitting: Family Medicine

## 2018-10-31 DIAGNOSIS — G479 Sleep disorder, unspecified: Secondary | ICD-10-CM | POA: Diagnosis not present

## 2018-10-31 DIAGNOSIS — N898 Other specified noninflammatory disorders of vagina: Secondary | ICD-10-CM | POA: Diagnosis not present

## 2018-10-31 DIAGNOSIS — R413 Other amnesia: Secondary | ICD-10-CM | POA: Diagnosis not present

## 2018-10-31 DIAGNOSIS — R443 Hallucinations, unspecified: Secondary | ICD-10-CM | POA: Diagnosis not present

## 2018-10-31 DIAGNOSIS — N8189 Other female genital prolapse: Secondary | ICD-10-CM | POA: Diagnosis not present

## 2018-12-15 ENCOUNTER — Other Ambulatory Visit: Payer: Self-pay | Admitting: Family Medicine

## 2019-05-29 ENCOUNTER — Other Ambulatory Visit: Payer: Self-pay | Admitting: Family Medicine

## 2019-06-12 DIAGNOSIS — R413 Other amnesia: Secondary | ICD-10-CM | POA: Diagnosis not present

## 2019-06-12 DIAGNOSIS — G479 Sleep disorder, unspecified: Secondary | ICD-10-CM | POA: Diagnosis not present

## 2019-06-12 DIAGNOSIS — R443 Hallucinations, unspecified: Secondary | ICD-10-CM | POA: Diagnosis not present

## 2019-06-17 DIAGNOSIS — Z4689 Encounter for fitting and adjustment of other specified devices: Secondary | ICD-10-CM | POA: Diagnosis not present

## 2019-06-17 DIAGNOSIS — N898 Other specified noninflammatory disorders of vagina: Secondary | ICD-10-CM | POA: Diagnosis not present

## 2019-07-16 ENCOUNTER — Ambulatory Visit (INDEPENDENT_AMBULATORY_CARE_PROVIDER_SITE_OTHER): Payer: Medicare Other

## 2019-07-16 ENCOUNTER — Other Ambulatory Visit: Payer: Self-pay

## 2019-07-16 DIAGNOSIS — Z23 Encounter for immunization: Secondary | ICD-10-CM | POA: Diagnosis not present

## 2019-07-30 ENCOUNTER — Other Ambulatory Visit: Payer: Self-pay | Admitting: Family Medicine

## 2019-08-27 ENCOUNTER — Other Ambulatory Visit: Payer: Self-pay | Admitting: Family Medicine

## 2019-09-24 ENCOUNTER — Other Ambulatory Visit: Payer: Self-pay | Admitting: Family Medicine

## 2019-09-24 NOTE — Telephone Encounter (Signed)
Requested medication (s) are due for refill today: yes  Requested medication (s) are on the active medication list:yes  Last refill:  08/27/2019  Future visit scheduled: no  Notes to clinic:  LOV-03/16/2018 Review for refill   Requested Prescriptions  Pending Prescriptions Disp Refills   bisoprolol-hydrochlorothiazide (ZIAC) 5-6.25 MG tablet [Pharmacy Med Name: BISOPROLOL-HCTZ 5-6.25 MG TAB] 30 tablet 0    Sig: TAKE ONE TABLET BY MOUTH DAILY     Cardiovascular: Beta Blocker + Diuretic Combos Failed - 09/24/2019  6:20 AM      Failed - K in normal range and within 180 days    Potassium  Date Value Ref Range Status  03/20/2018 3.2 (L) 3.5 - 5.2 mmol/L Final  02/14/2015 3.8 mmol/L Final    Comment:    3.5-5.1 NOTE: New Reference Range  12/30/14          Failed - Na in normal range and within 180 days    Sodium  Date Value Ref Range Status  03/20/2018 137 134 - 144 mmol/L Final  02/14/2015 136 mmol/L Final    Comment:    135-145 NOTE: New Reference Range  12/30/14          Failed - Cr in normal range and within 180 days    Creatinine  Date Value Ref Range Status  02/14/2015 1.02 (H) mg/dL Final    Comment:    0.44-1.00 NOTE: New Reference Range  12/30/14    Creatinine, Ser  Date Value Ref Range Status  03/20/2018 0.96 0.57 - 1.00 mg/dL Final         Failed - Ca in normal range and within 180 days    Calcium  Date Value Ref Range Status  03/20/2018 9.1 8.7 - 10.3 mg/dL Final   Calcium, Total  Date Value Ref Range Status  02/14/2015 9.1 mg/dL Final    Comment:    8.9-10.3 NOTE: New Reference Range  12/30/14          Failed - Valid encounter within last 6 months    Recent Outpatient Visits          1 year ago Dyspnea on exertion   [facility] Birdie Sons, MD   2 years ago [place], Donald E, MD   2 years ago [place], Donald E, MD   2 years ago [place], Donald E, MD   2 years ago Seasonal allergic rhinitis due to pollen   [place], [place], [place] - Patient is not pregnant      Passed - Last BP in normal range    BP Readings from Last 1 Encounters:  03/20/18 122/62         Passed - Last Heart Rate in normal range    Pulse Readings from Last 1 Encounters:  03/20/18 68

## 2019-11-19 DIAGNOSIS — N898 Other specified noninflammatory disorders of vagina: Secondary | ICD-10-CM | POA: Diagnosis not present

## 2019-11-19 DIAGNOSIS — Z4689 Encounter for fitting and adjustment of other specified devices: Secondary | ICD-10-CM | POA: Diagnosis not present

## 2019-11-21 ENCOUNTER — Other Ambulatory Visit: Payer: Self-pay | Admitting: Family Medicine

## 2019-11-21 DIAGNOSIS — I10 Essential (primary) hypertension: Secondary | ICD-10-CM

## 2019-11-21 MED ORDER — BISOPROLOL-HYDROCHLOROTHIAZIDE 5-6.25 MG PO TABS: 1.0000 | ORAL_TABLET | Freq: Every day | ORAL | 0 refills | Status: DC

## 2019-11-21 NOTE — Addendum Note (Signed)
Addended by: Benjiman Core on: 11/21/2019 03:11 PM   Modules accepted: Orders

## 2019-11-21 NOTE — Telephone Encounter (Signed)
Pt has scheduled an appt for 2.19.21/ Pt has 15 pills left and would like to know if she can receive a curtesy refill for the 7 days that will be needed before her 2.19.21 appt/ please call jennifer and advise

## 2019-11-23 ENCOUNTER — Other Ambulatory Visit: Payer: Self-pay | Admitting: Family Medicine

## 2019-11-23 NOTE — Telephone Encounter (Signed)
Requested medication (s) are due for refill today: yes  Requested medication (s) are on the active medication list: yes  Last refill:  10/31/18  Future visit scheduled: yes  Notes to clinic:  RX expired    Requested Prescriptions  Pending Prescriptions Disp Refills   raloxifene (EVISTA) 60 MG tablet [Pharmacy Med Name: RALOXIFENE HCL 60 MG TABLET] 90 tablet 11    Sig: TAKE ONE TABLET BY MOUTH DAILY      OB/GYN:  Selective Estrogen Receptor Modulators Failed - 11/23/2019  6:51 AM      Failed - Valid encounter within last 12 months    Recent Outpatient Visits           1 year ago Dyspnea on exertion   [facility] Malva Limes, MD   2 years ago Dysuria   [facility] Malva Limes, MD   2 years ago Leukorrhea   [facility] Malva Limes, MD   2 years ago Dysuria   [facility] Malva Limes, MD   2 years ago Seasonal allergic rhinitis due to pollen   [facility] [place], [place]       Future Appointments             In 2 weeks Sherrie Mustache, Demetrios Isaacs, MD [facility]

## 2019-11-28 ENCOUNTER — Other Ambulatory Visit: Payer: Self-pay | Admitting: Family Medicine

## 2019-11-28 NOTE — Telephone Encounter (Signed)
Requested medication (s) are due for refill today:   Yes  Requested medication (s) are on the active medication list:   Yes  Future visit scheduled:   Yes in 2 weeks with Dr Sherrie Mustache   Last ordered: Forwarded to Dr. Sherrie Mustache for further disposition until he sees her in 2 weeks.   Requested Prescriptions  Pending Prescriptions Disp Refills   raloxifene (EVISTA) 60 MG tablet 30 tablet 12    Sig: Take 1 tablet (60 mg total) by mouth daily.      OB/GYN:  Selective Estrogen Receptor Modulators Failed - 11/28/2019 11:08 AM      Failed - Valid encounter within last 12 months    Recent Outpatient Visits           1 year ago Dyspnea on exertion   [facility] Malva Limes, MD   2 years ago Dysuria   [facility] Malva Limes, MD   2 years ago Leukorrhea   [facility] Malva Limes, MD   2 years ago Dysuria   [facility] Malva Limes, MD   2 years ago Seasonal allergic rhinitis due to pollen   [facility] [place], [place]       Future Appointments             In 2 weeks Sherrie Mustache, Demetrios Isaacs, MD [facility]

## 2019-11-28 NOTE — Telephone Encounter (Signed)
Medication Refill - Medication: raloxifene (EVISTA) 60 MG tablet   Pt's daughter jennifer called requesting a refill, pt does not have enough to last her until her appt  Has the patient contacted their pharmacy? Yes.   (Agent: If no, request that the patient contact the pharmacy for the refill.) (Agent: If yes, when and what did the pharmacy advise?)  Preferred Pharmacy (with phone number or street name):  [facility] [address] - [place], [place] - [address] [facility]  [address] [place] [address]  Phone: [phone] Fax: [phone]     Agent: Please be advised that RX refills may take up to 3 business days. We ask that you follow-up with your pharmacy.

## 2019-11-29 MED ORDER — RALOXIFENE HCL 60 MG PO TABS: 60.0000 mg | ORAL_TABLET | Freq: Every day | ORAL | 0 refills | Status: DC

## 2019-12-13 ENCOUNTER — Ambulatory Visit: Payer: Medicare Other | Admitting: Family Medicine

## 2019-12-18 ENCOUNTER — Other Ambulatory Visit: Payer: Self-pay | Admitting: Family Medicine

## 2019-12-18 DIAGNOSIS — I10 Essential (primary) hypertension: Secondary | ICD-10-CM

## 2019-12-24 NOTE — Progress Notes (Signed)
m      Patient: Gloria Mosley Female    DOB: 1933/01/09   86 y.o.   MRN: [id number] Visit Date: 12/25/2019  Today's Provider: [name], MD   Chief Complaint  Patient presents with  . Medication Refill   Subjective:     HPI  Hypertension, follow-up:  BP Readings from Last 3 Encounters:  03/20/18 122/62  07/25/17 (!) 168/76  07/04/17 (!) 174/77    She was last seen for hypertension over 1 years ago.  BP at that visit was see above. Management since that visit includes no changes.She reports good compliance with treatment but she has been out of her BP medications.  She is not having side effects.   Outside blood pressures are not being checked. She is experiencing none.  Patient denies chest pain, chest pressure/discomfort, irregular heart beat and palpitations.   Cardiovascular risk factors include advanced age (older than 31 for men, 13 for women), dyslipidemia and hypertension.  Use of agents associated with hypertension: none.   ------------------------------------------------------------------------    Lipid/Cholesterol, Follow-up:   Last seen for this over 1 years ago.  Management since that visit includes no change.  Last Lipid Panel:    Component Value Date/Time   CHOL 161 08/03/2016 1019   TRIG 61 08/03/2016 1019   HDL 61 08/03/2016 1019   CHOLHDL 2.6 08/03/2016 1019   LDLCALC 88 08/03/2016 1019    She reports good compliance with treatment. She is not having side effects.   Wt Readings from Last 3 Encounters:  03/20/18 117 lb (53.1 kg)  07/25/17 121 lb 11.2 oz (55.2 kg)  07/04/17 120 lb 12.8 oz (54.8 kg)    ------------------------------------------------------------------------  Allergies  Allergen Reactions  . Sulfa Antibiotics     Other reaction(s): Other (See Comments) Does not tolerate well     Current Outpatient Medications:  .  atorvastatin (LIPITOR) 10 MG tablet, TAKE ONE TABLET BY MOUTH EVERY OTHER DAY, Disp: 45 tablet, Rfl:  4 .  bisoprolol-hydrochlorothiazide (ZIAC) 5-6.25 MG tablet, Take 1 tablet by mouth daily., Disp: 30 tablet, Rfl: 0 .  carvedilol (COREG) 12.5 MG tablet, carvedilol 12.5 mg tablet, Disp: , Rfl:  .  Cholecalciferol (VITAMIN D) 2000 units CAPS, Take 1 capsule (2,000 Units total) by mouth daily., Disp: 1 capsule, Rfl: 0 .  conjugated estrogens (PREMARIN) vaginal cream, Place 1 Applicatorful vaginally daily. Apply 0.5mg  (pea-sized amount)  just inside the vaginal introitus with a finger-tip every night for two weeks and then Monday, Wednesday and Friday nights., Disp: 30 g, Rfl: 12 .  fluticasone (FLONASE) 50 MCG/ACT nasal spray, Place 2 sprays into both nostrils daily., Disp: 16 g, Rfl: 6 .  haloperidol (HALDOL) 1 MG tablet, Take 1 tablet (1 mg total) by mouth at bedtime., Disp: 30 tablet, Rfl: 1 .  hydrochlorothiazide (HYDRODIURIL) 12.5 MG tablet, Take 1 tablet (12.5 mg total) by mouth daily., Disp: 30 tablet, Rfl: 3 .  LORazepam (ATIVAN) 0.5 MG tablet, Take 0.5-1 tablets (0.25-0.5 mg total) by mouth at bedtime., Disp: 30 tablet, Rfl: 1 .  meloxicam (MOBIC) 7.5 MG tablet, meloxicam 7.5 mg tablet  Take 1 tablet every day by oral route with meals., Disp: , Rfl:  .  metoprolol succinate (TOPROL-XL) 25 MG 24 hr tablet, Take 1 tablet (25 mg total) by mouth daily., Disp: 30 tablet, Rfl: 3 .  oxyCODONE (OXY IR/ROXICODONE) 5 MG immediate release tablet, oxycodone 5 mg tablet, Disp: , Rfl:  .  raloxifene (EVISTA) 60 MG tablet, Take 1 tablet (  60 mg total) by mouth daily., Disp: 30 tablet, Rfl: 0 .  risperiDONE (RISPERDAL) 0.5 MG tablet, TAKE ONE TABLET BY MOUTH EVERY NIGHT AT BEDTIME, Disp: 30 tablet, Rfl: 11  Review of Systems  Constitutional: Negative.   Respiratory: Negative.   Cardiovascular: Negative.   Endocrine: Negative.   Musculoskeletal: Negative.     Social History   Tobacco Use  . Smoking status: Former Smoker    Packs/day: 1.00    Types: Cigarettes    Quit date: 10/24/1950    Years since  quitting: 69.2  . Smokeless tobacco: Never Used  Substance Use Topics  . Alcohol use: No    Alcohol/week: 0.0 standard drinks      Objective:   There were no vitals taken for this visit. Vitals:   12/25/19 0852  BP: (!) 164/72  Pulse: (!) 54  Temp: (!) 96.6 F (35.9 C)  TempSrc: Temporal  Weight: 112 lb (50.8 kg)  Height: 5\' 4"  (1.626 m)     Physical Exam   General: Appearance:    Thin female in no acute distress. Very hard of hearing.  Eyes:    PERRL, conjunctiva/corneas clear, EOM's intact       Lungs:     Clear to auscultation bilaterally, respirations unlabored  Heart:    Bradycardic. Normal rhythm. No murmurs, rubs, or gallops.   MS:   All extremities are intact.   Neurologic:   Awake, alert, oriented x 3. No apparent focal neurological           defect.          Assessment & Plan    1. Essential (primary) hypertension Currently out of medications. Will send refill to her pharmacy.  - Comprehensive metabolic panel - CBC  2. Vitamin D deficiency Not currently on supplement - VITAMIN D 25 Hydroxy (Vit-D Deficiency, Fractures)  3. Hyperlipidemia, unspecified hyperlipidemia type She is tolerating atorvastatin well with no adverse effects.   - Comprehensive metabolic panel - Lipid panel  4. Age-related osteoporosis without current pathological fracture  - DG Bone Density [place]; Future  5. Hearing loss, unspecified hearing loss type, unspecified laterality    The entirety of the information documented in the History of Present Illness, Review of Systems and Physical Exam were personally obtained by me. Portions of this information were initially documented by , CMA and reviewed by me for thoroughness and accuracy.     Angelica Ran, MD  [facility]

## 2019-12-25 ENCOUNTER — Encounter: Payer: Self-pay | Admitting: Family Medicine

## 2019-12-25 ENCOUNTER — Ambulatory Visit: Payer: Medicare Other | Admitting: Family Medicine

## 2019-12-25 ENCOUNTER — Other Ambulatory Visit: Payer: Self-pay

## 2019-12-25 VITALS — BP 164/72 | HR 54 | Temp 96.6°F | Ht 64.0 in | Wt 112.0 lb

## 2019-12-25 DIAGNOSIS — R413 Other amnesia: Secondary | ICD-10-CM | POA: Diagnosis not present

## 2019-12-25 DIAGNOSIS — E785 Hyperlipidemia, unspecified: Secondary | ICD-10-CM

## 2019-12-25 DIAGNOSIS — H9190 Unspecified hearing loss, unspecified ear: Secondary | ICD-10-CM | POA: Insufficient documentation

## 2019-12-25 DIAGNOSIS — G479 Sleep disorder, unspecified: Secondary | ICD-10-CM | POA: Diagnosis not present

## 2019-12-25 DIAGNOSIS — E559 Vitamin D deficiency, unspecified: Secondary | ICD-10-CM | POA: Diagnosis not present

## 2019-12-25 DIAGNOSIS — M810 Age-related osteoporosis without current pathological fracture: Secondary | ICD-10-CM

## 2019-12-25 DIAGNOSIS — I10 Essential (primary) hypertension: Secondary | ICD-10-CM

## 2019-12-25 DIAGNOSIS — R443 Hallucinations, unspecified: Secondary | ICD-10-CM | POA: Diagnosis not present

## 2019-12-25 MED ORDER — RALOXIFENE HCL 60 MG PO TABS: 60.0000 mg | ORAL_TABLET | Freq: Every day | ORAL | 2 refills | Status: DC

## 2019-12-25 MED ORDER — BISOPROLOL-HYDROCHLOROTHIAZIDE 5-6.25 MG PO TABS: 1.0000 | ORAL_TABLET | Freq: Every day | ORAL | 2 refills | Status: DC

## 2019-12-26 ENCOUNTER — Telehealth: Payer: Self-pay

## 2019-12-26 DIAGNOSIS — I10 Essential (primary) hypertension: Secondary | ICD-10-CM

## 2019-12-26 LAB — CBC
Hematocrit: 34 % (ref 34.0–46.6)
Hemoglobin: 11.5 g/dL (ref 11.1–15.9)
MCH: 31.3 pg (ref 26.6–33.0)
MCHC: 33.8 g/dL (ref 31.5–35.7)
MCV: 93 fL (ref 79–97)
Platelets: 171 10*3/uL (ref 150–450)
RBC: 3.67 x10E6/uL — ABNORMAL LOW (ref 3.77–5.28)
RDW: 12.6 % (ref 11.7–15.4)
WBC: 5.2 10*3/uL (ref 3.4–10.8)

## 2019-12-26 LAB — COMPREHENSIVE METABOLIC PANEL
ALT: 10 IU/L (ref 0–32)
AST: 16 IU/L (ref 0–40)
Albumin/Globulin Ratio: 1.2 (ref 1.2–2.2)
Albumin: 3.9 g/dL (ref 3.6–4.6)
Alkaline Phosphatase: 73 IU/L (ref 39–117)
BUN/Creatinine Ratio: 11 — ABNORMAL LOW (ref 12–28)
BUN: 11 mg/dL (ref 8–27)
Bilirubin Total: 0.5 mg/dL (ref 0.0–1.2)
CO2: 28 mmol/L (ref 20–29)
Calcium: 9.2 mg/dL (ref 8.7–10.3)
Chloride: 97 mmol/L (ref 96–106)
Creatinine, Ser: 0.96 mg/dL (ref 0.57–1.00)
GFR calc Af Amer: 63 mL/min/{1.73_m2} (ref >59)
GFR calc non Af Amer: 54 mL/min/{1.73_m2} — ABNORMAL LOW (ref >59)
Globulin, Total: 3.2 g/dL (ref 1.5–4.5)
Glucose: 75 mg/dL (ref 65–99)
Potassium: 3.1 mmol/L — ABNORMAL LOW (ref 3.5–5.2)
Sodium: 139 mmol/L (ref 134–144)
Total Protein: 7.1 g/dL (ref 6.0–8.5)

## 2019-12-26 LAB — LIPID PANEL
Chol/HDL Ratio: 1.7 ratio (ref 0.0–4.4)
Cholesterol, Total: 138 mg/dL (ref 100–199)
HDL: 81 mg/dL (ref >39)
LDL Chol Calc (NIH): 47 mg/dL (ref 0–99)
Triglycerides: 40 mg/dL (ref 0–149)
VLDL Cholesterol Cal: 10 mg/dL (ref 5–40)

## 2019-12-26 LAB — VITAMIN D 25 HYDROXY (VIT D DEFICIENCY, FRACTURES): Vit D, 25-Hydroxy: 9.4 ng/mL — ABNORMAL LOW (ref 30.0–100.0)

## 2019-12-26 MED ORDER — SPIRONOLACTONE 25 MG PO TABS: 25.0000 mg | ORAL_TABLET | Freq: Every day | ORAL | 1 refills | Status: DC

## 2019-12-26 NOTE — Telephone Encounter (Signed)
Patient's daughter Victorino Dike advised and agrees with treatment plan. Prescription sent into pharmacy. Follow up appointment scheduled 02/07/2020 at 10:40am.

## 2019-12-26 NOTE — Telephone Encounter (Signed)
-----   Message from Malva Limes, MD sent at 12/26/2019 10:13 AM EST ----- Vitamin d levels are EXTREMELY low. This makes osteoporosis much worse. She needs to start taking OTC vitamin D3 2000 units once every day.  Potassium is a little low, need to start spironolactone 25mg  once a day #30, rf x 1. This will help blood pressure and bring up potassium levels too. Continue bisoprolol-hctz.  Please schedule follow up office visit 6 weeks to check BP and labs.

## 2020-01-22 ENCOUNTER — Other Ambulatory Visit: Payer: Self-pay | Admitting: Family Medicine

## 2020-01-22 NOTE — Telephone Encounter (Signed)
Requested Prescriptions  Pending Prescriptions Disp Refills  . atorvastatin (LIPITOR) 10 MG tablet [Pharmacy Med Name: ATORVASTATIN 10 MG TABLET] 45 tablet 3    Sig: TAKE ONE TABLET BY MOUTH EVERY OTHER DAY     Cardiovascular:  Antilipid - Statins Failed - 01/22/2020  6:21 AM      Failed - LDL in normal range and within 360 days    LDL Chol Calc (NIH)  Date Value Ref Range Status  12/25/2019 47 0 - 99 mg/dL Final         Passed - Total Cholesterol in normal range and within 360 days    Cholesterol, Total  Date Value Ref Range Status  12/25/2019 138 100 - 199 mg/dL Final         Passed - HDL in normal range and within 360 days    HDL  Date Value Ref Range Status  12/25/2019 81 >39 mg/dL Final         Passed - Triglycerides in normal range and within 360 days    Triglycerides  Date Value Ref Range Status  12/25/2019 40 0 - 149 mg/dL Final         Passed - Patient is not pregnant      Passed - Valid encounter within last 12 months    Recent Outpatient Visits          4 weeks ago Essential (primary) hypertension   [facility] Malva Limes, MD   1 year ago Dyspnea on exertion   [facility] Malva Limes, MD   2 years ago Dysuria   [facility] Malva Limes, MD   2 years ago Leukorrhea   [facility] Malva Limes, MD   2 years ago Dysuria   [facility] Malva Limes, MD      Future Appointments            In 2 weeks Fisher, Demetrios Isaacs, MD [facility]

## 2020-01-23 ENCOUNTER — Ambulatory Visit
Admission: RE | Admit: 2020-01-23 | Discharge: 2020-01-23 | Disposition: A | Discharge status: Home / Self Care | Payer: Medicare Other | Source: Ambulatory Visit | Attending: Family Medicine | Admitting: Family Medicine

## 2020-01-23 DIAGNOSIS — M810 Age-related osteoporosis without current pathological fracture: Secondary | ICD-10-CM | POA: Diagnosis not present

## 2020-01-24 ENCOUNTER — Telehealth: Payer: Self-pay | Admitting: Family Medicine

## 2020-01-24 ENCOUNTER — Ambulatory Visit: Payer: Self-pay

## 2020-01-24 ENCOUNTER — Telehealth: Payer: Self-pay

## 2020-01-24 MED ORDER — ALENDRONATE SODIUM 70 MG PO TABS: 70.0000 mg | ORAL_TABLET | ORAL | 11 refills | Status: DC

## 2020-01-24 NOTE — Telephone Encounter (Signed)
Yes, the Fosamax will not work if she is not getting enough vitamin D, so she needs to take both.

## 2020-01-24 NOTE — Telephone Encounter (Signed)
Jennifer advised  

## 2020-01-24 NOTE — Telephone Encounter (Signed)
Daughter called stating that her mother was started on Fosamax today.  She need to know if she is to take other medication for bones strength Raloxifene. Call transferred to office for advice for patient.  Reason for Disposition . [1] Caller requesting NON-URGENT health information AND [2] PCP's office is the best resource  Answer Assessment - Initial Assessment Questions 1. REASON FOR CALL or QUESTION: "What is your reason for calling today?" or "How can I best help you?" or "What question do you have that I can help answer?"     Patient started on new medication today. Daughter has quesations  Protocols used: INFORMATION ONLY CALL - NO TRIAGE-A-AH

## 2020-01-24 NOTE — Telephone Encounter (Signed)
Spoke with pt's daughter, Victorino Dike to let her know Dr. Sherrie Mustache said for pt to continue taking the raloxifene (EVISTA) 60 MG tablet along with the alendronate (FOSAMAX) 70 MG tablet and over the counter Vitamin D3.  Thanks, [employer]

## 2020-01-24 NOTE — Telephone Encounter (Signed)
Patient's daughter Victorino Dike advised. Patient did not have any issues with Fosamax previously. RX sent to pharmacy.

## 2020-01-24 NOTE — Telephone Encounter (Signed)
-----   Message from Malva Limes, MD sent at 01/23/2020  5:24 PM EDT ----- Bone density has gotten much worse, had moderate to severe osteoporosis. We usually prescribed Fosamax for this, but I think she tried it many years ago but I don't have a record of whether or not she had any problems with it. If not then she needs to start Fosamax 70mg  weekly. If she thinks she had trouble with Fosamax then she needs referral to endocrinology for Prolia which is a once a year injectable medication for osteoporosis.

## 2020-01-24 NOTE — Telephone Encounter (Signed)
Pt daughter Victorino Dike is calling and would like to know if her mom still needs to take vit d3 OTC. Please advice

## 2020-01-27 ENCOUNTER — Telehealth: Payer: Self-pay | Admitting: Family Medicine

## 2020-01-27 NOTE — Telephone Encounter (Signed)
Left message for patient to schedule Annual Wellness Visit.  Please schedule with Nurse Health Advisor Victoria Britt, RN at [facility]  

## 2020-02-05 NOTE — Progress Notes (Signed)
I,Roshena L Chambers,acting as a scribe for Mila Merry, MD.,have documented all relevant documentation on the behalf of Mila Merry, MD,as directed by  Mila Merry, MD while in the presence of Mila Merry, MD.  Established patient visit     Patient: Gloria Mosley   DOB: [birth year]   86 y.o. Female  MRN: [id number] Visit Date: 02/07/2020  Today's healthcare provider: Mila Merry, MD  Subjective:    Chief Complaint  Patient presents with  . Hypertension   HPI Hypertension, follow-up  BP Readings from Last 3 Encounters:  02/07/20 (!) 142/70  12/25/19 (!) 164/72  03/20/18 122/62   Wt Readings from Last 3 Encounters:  02/07/20 111 lb (50.3 kg)  12/25/19 112 lb (50.8 kg)  03/20/18 117 lb (53.1 kg)   She was last seen for hypertension 6 weeks ago.  BP at that visit was 164/72. Management since that visit includes start spironolactone 25mg  once a day due to low potassium levels. She reports good compliance with treatment. She is not having side effects.  She is exercising. She is adherent to low salt diet.   Outside blood pressures are not checked. Symptoms: No chest pain No chest pressure/discomfort No dyspnea (difficulty breathing) No lower extremity edema No orthopnea No palpitations No paroxysmal nocturnal dyspnea  No syncope  She does not smoke. She is following a Low Sodium diet. Use of agents associated with hypertension: none.   Last basic metabolic panel Lab Results  Component Value Date   GLUCOSE 75 12/25/2019   NA 139 12/25/2019   K 3.1 (L) 12/25/2019   CL 97 12/25/2019   CO2 28 12/25/2019   BUN 11 12/25/2019   CREATININE 0.96 12/25/2019   GFRNONAA 54 (L) 12/25/2019   GFRAA 63 12/25/2019   CALCIUM 9.2 12/25/2019   Last lipids Lab Results  Component Value Date   CHOL 138 12/25/2019   HDL 81 12/25/2019   LDLCALC 47 12/25/2019   TRIG 40 12/25/2019   CHOLHDL 1.7 12/25/2019    The ASCVD Risk score 02/24/2020 DC Jr., et al., 2013) failed  to calculate for the following reasons:   The 2013 ASCVD risk score is only valid for ages 65 to 4    Follow up for Hypokalemia:  The patient was last seen for this 6 weeks ago. Changes made at last visit include starting spironolactone 25mg  once a day.  She reports good compliance with treatment. She feels that condition is Unchanged. She is not having side effects.   ----------------------------------------------------------------------------------------- Follow up for Vitamin D Deficiency:  The patient was last seen for this 6 weeks ago. Changes made at last visit include advising patient to start taking OTC vitamin D3 2000 units once every day. She has also started on alendronate weekly which she is tolerated well without adverse effects.  She reports good compliance with treatment. She feels that condition is Unchanged. She is not having side effects.   -----------------------------------------------------------------------------------------     Medications: Outpatient Medications Prior to Visit  Medication Sig  . alendronate (FOSAMAX) 70 MG tablet Take 1 tablet (70 mg total) by mouth every 7 (seven) days. Take with a full glass of water on an empty stomach.  76 atorvastatin (LIPITOR) 10 MG tablet TAKE ONE TABLET BY MOUTH EVERY OTHER DAY  . bisoprolol-hydrochlorothiazide (ZIAC) 5-6.25 MG tablet Take 1 tablet by mouth daily.  . fluticasone (FLONASE) 50 MCG/ACT nasal spray Place 2 sprays into both nostrils daily.  . raloxifene (EVISTA) 60 MG tablet Take 1 tablet (  60 mg total) by mouth daily.  . risperiDONE (RISPERDAL) 0.5 MG tablet TAKE ONE TABLET BY MOUTH EVERY NIGHT AT BEDTIME  . spironolactone (ALDACTONE) 25 MG tablet Take 1 tablet (25 mg total) by mouth daily.   No facility-administered medications prior to visit.    Review of Systems  Constitutional: Negative for appetite change, chills, fatigue and fever.  Respiratory: Negative for chest tightness and shortness of  breath.   Cardiovascular: Negative for chest pain and palpitations.  Gastrointestinal: Negative for abdominal pain, nausea and vomiting.  Neurological: Negative for dizziness and weakness.        Objective:    BP (!) 142/70 (BP Location: Right Arm, Patient Position: Sitting, Cuff Size: Large)   Pulse (!) 54   Temp (!) 96.9 F (36.1 C) (Temporal)   Wt 111 lb (50.3 kg)   SpO2 99% Comment: room air  BMI 19.05 kg/m    Physical Exam   General: Appearance:    Thin female in no acute distress  Eyes:    PERRL, conjunctiva/corneas clear, EOM's intact       Lungs:     Clear to auscultation bilaterally, respirations unlabored  Heart:    Bradycardic. Normal rhythm. No murmurs, rubs, or gallops.   MS:   All extremities are intact.   Neurologic:   Awake, alert, oriented x 3. No apparent focal neurological           defect.          Assessment & Plan:    1. Hypokalemia Doing well with addition of Spironolactone. Will recheck labs.  - Renal Function Panel  2. Essential (primary) hypertension Blood pressure improved since addition of Spironolactone. Will recheck labs. - Renal Function Panel  3. Osteoporosis, senile Tolerating addition of alendronate well. Has been on Evista for several years. Will continue combination therapy for the time being - TSH  Return in about 4 months (around 06/08/2020) for blood pressure follow up.     The entirety of the information documented in the History of Present Illness, Review of Systems and Physical Exam were personally obtained by me. Portions of this information were initially documented by the CMA and reviewed by me for thoroughness and accuracy.  [name], MD      [name], MD  [facility] [phone] (phone) [phone] (fax)  Story

## 2020-02-07 ENCOUNTER — Other Ambulatory Visit: Payer: Self-pay

## 2020-02-07 ENCOUNTER — Ambulatory Visit: Payer: Medicare Other | Admitting: Family Medicine

## 2020-02-07 ENCOUNTER — Encounter: Payer: Self-pay | Admitting: Family Medicine

## 2020-02-07 VITALS — BP 142/70 | HR 54 | Temp 96.9°F | Wt 111.0 lb

## 2020-02-07 DIAGNOSIS — E876 Hypokalemia: Secondary | ICD-10-CM

## 2020-02-07 DIAGNOSIS — M810 Age-related osteoporosis without current pathological fracture: Secondary | ICD-10-CM

## 2020-02-07 DIAGNOSIS — I10 Essential (primary) hypertension: Secondary | ICD-10-CM | POA: Diagnosis not present

## 2020-02-10 ENCOUNTER — Telehealth: Payer: Self-pay

## 2020-02-10 DIAGNOSIS — I10 Essential (primary) hypertension: Secondary | ICD-10-CM

## 2020-02-10 LAB — RENAL FUNCTION PANEL
Albumin: 4.1 g/dL (ref 3.6–4.6)
BUN/Creatinine Ratio: 11 — ABNORMAL LOW (ref 12–28)
BUN: 12 mg/dL (ref 8–27)
CO2: 22 mmol/L (ref 20–29)
Calcium: 9.1 mg/dL (ref 8.7–10.3)
Chloride: 94 mmol/L — ABNORMAL LOW (ref 96–106)
Creatinine, Ser: 1.07 mg/dL — ABNORMAL HIGH (ref 0.57–1.00)
GFR calc Af Amer: 55 mL/min/{1.73_m2} — ABNORMAL LOW (ref >59)
GFR calc non Af Amer: 48 mL/min/{1.73_m2} — ABNORMAL LOW (ref >59)
Glucose: 86 mg/dL (ref 65–99)
Phosphorus: 3.5 mg/dL (ref 3.0–4.3)
Potassium: 4 mmol/L (ref 3.5–5.2)
Sodium: 130 mmol/L — ABNORMAL LOW (ref 134–144)

## 2020-02-10 LAB — TSH: TSH: 2.4 u[IU]/mL (ref 0.450–4.500)

## 2020-02-10 MED ORDER — SPIRONOLACTONE 25 MG PO TABS: 25.0000 mg | ORAL_TABLET | Freq: Every day | ORAL | 1 refills | Status: DC

## 2020-02-10 NOTE — Telephone Encounter (Signed)
Patient's daughter Victorino Dike advised. RX sent to pharmacy.

## 2020-02-10 NOTE — Telephone Encounter (Signed)
-----   Message from Malva Limes, MD sent at 02/08/2020  8:28 AM EDT ----- Potassium levels are good. Sodium level is a little bit lower, but not low enough to require any medication. . Continue current dose of spironolactone and bisoprolol-hctz. Please send in refill for spironolactone, #90 + 1 rf x1 Will need to recheck sodium levels in 2 months. (just labs, no o.v. required) will contact her in June when its time to check sodium.

## 2020-02-18 DIAGNOSIS — R300 Dysuria: Secondary | ICD-10-CM | POA: Diagnosis not present

## 2020-02-18 DIAGNOSIS — N898 Other specified noninflammatory disorders of vagina: Secondary | ICD-10-CM | POA: Diagnosis not present

## 2020-02-18 DIAGNOSIS — R350 Frequency of micturition: Secondary | ICD-10-CM | POA: Diagnosis not present

## 2020-02-18 DIAGNOSIS — Z4689 Encounter for fitting and adjustment of other specified devices: Secondary | ICD-10-CM | POA: Diagnosis not present

## 2020-04-01 ENCOUNTER — Telehealth: Payer: Self-pay | Admitting: Family Medicine

## 2020-04-01 DIAGNOSIS — E871 Hypo-osmolality and hyponatremia: Secondary | ICD-10-CM

## 2020-04-01 NOTE — Telephone Encounter (Signed)
Patient is due to check renal panel to follow up on hyponatremia on labs done in April.

## 2020-04-01 NOTE — Telephone Encounter (Signed)
Tried calling patient. Left message to call back. OK for [facility] triage to advise of message then route back to office.

## 2020-04-01 NOTE — Telephone Encounter (Signed)
Pt's daughter Victorino Dike returned office call. Made daughter aware of message below, she will bring pt in to have lab completed.

## 2020-04-03 DIAGNOSIS — E871 Hypo-osmolality and hyponatremia: Secondary | ICD-10-CM | POA: Diagnosis not present

## 2020-04-04 LAB — RENAL FUNCTION PANEL
Albumin: 3.9 g/dL (ref 3.6–4.6)
BUN/Creatinine Ratio: 12 (ref 12–28)
BUN: 16 mg/dL (ref 8–27)
CO2: 22 mmol/L (ref 20–29)
Calcium: 9 mg/dL (ref 8.7–10.3)
Chloride: 96 mmol/L (ref 96–106)
Creatinine, Ser: 1.3 mg/dL — ABNORMAL HIGH (ref 0.57–1.00)
GFR calc Af Amer: 43 mL/min/{1.73_m2} — ABNORMAL LOW (ref >59)
GFR calc non Af Amer: 37 mL/min/{1.73_m2} — ABNORMAL LOW (ref >59)
Glucose: 91 mg/dL (ref 65–99)
Phosphorus: 3.6 mg/dL (ref 3.0–4.3)
Potassium: 4.1 mmol/L (ref 3.5–5.2)
Sodium: 133 mmol/L — ABNORMAL LOW (ref 134–144)

## 2020-04-22 ENCOUNTER — Telehealth: Payer: Self-pay | Admitting: Family Medicine

## 2020-04-22 DIAGNOSIS — M810 Age-related osteoporosis without current pathological fracture: Secondary | ICD-10-CM

## 2020-04-22 NOTE — Telephone Encounter (Signed)
Need referral to to endocrinology for treatment with Prolia. Prolia is a once every 6 month injection that works very well and has virtually no side effects.

## 2020-04-22 NOTE — Telephone Encounter (Signed)
Attempted to contact patient, no answer left a voicemail. Okay for [facility] triage to advise patient. ° °

## 2020-04-22 NOTE — Telephone Encounter (Signed)
Pt's daughter called in to make provider aware that pt's medication alendronate (FOSAMAX) 70 MG table is causing diarrhea. Pt's family has advised pt to stop taking medication due to it upsetting her stomach, daughter would like to know if there is something else that pt could take?   Please advise.

## 2020-04-23 NOTE — Telephone Encounter (Signed)
Phone call to pt's daughter.  Left vm to return call to the office to receive recommendations per Dr. Sherrie Mustache.

## 2020-04-23 NOTE — Telephone Encounter (Signed)
Pt.'s daughter, Victorino Dike, called back. Given message from Dr. Sherrie Mustache. States she will talk to pt. And call us back.

## 2020-04-28 NOTE — Telephone Encounter (Signed)
Endocrinology referral order pended. Please advise on appropriate diagnosis code to associate with referral and whether you have a preference for a specific Endocrinologist.

## 2020-04-28 NOTE — Telephone Encounter (Signed)
Pts daughter Gloria Mosley called back and states that she has spoke with Aurea Graff and the Pt decided to do the shots and they will like the referral/ please advise

## 2020-04-28 NOTE — Addendum Note (Signed)
Addended by: Benjiman Core on: 04/28/2020 03:29 PM   Modules accepted: Orders

## 2020-04-30 NOTE — Addendum Note (Signed)
Addended by: Malva Limes on: 04/30/2020 09:31 AM   Modules accepted: Orders

## 2020-05-21 ENCOUNTER — Telehealth: Payer: Self-pay | Admitting: Family Medicine

## 2020-05-21 NOTE — Telephone Encounter (Signed)
Copied from CRM [id number]. Topic: Medicare AWV >> May 21, 2020  2:14 PM Claudette Laws R wrote: Reason for CRM:  Left message for patient to call back and schedule Medicare Annual Wellness Visit (AWV) either virtually or in office.  No hx of AWV eligible as of 02/22/2000  Please schedule at anytime with [facility] Health Advisor.

## 2020-05-26 DIAGNOSIS — M810 Age-related osteoporosis without current pathological fracture: Secondary | ICD-10-CM | POA: Diagnosis not present

## 2020-05-26 DIAGNOSIS — Z4689 Encounter for fitting and adjustment of other specified devices: Secondary | ICD-10-CM | POA: Diagnosis not present

## 2020-05-26 DIAGNOSIS — N898 Other specified noninflammatory disorders of vagina: Secondary | ICD-10-CM | POA: Diagnosis not present

## 2020-05-26 DIAGNOSIS — N1832 Chronic kidney disease, stage 3b: Secondary | ICD-10-CM | POA: Diagnosis not present

## 2020-05-26 DIAGNOSIS — E559 Vitamin D deficiency, unspecified: Secondary | ICD-10-CM | POA: Diagnosis not present

## 2020-05-26 LAB — COMPREHENSIVE METABOLIC PANEL
Albumin: 4.1 (ref 3.5–5.0)
Calcium: 9.3 (ref 8.7–10.7)

## 2020-05-26 LAB — BASIC METABOLIC PANEL
BUN: 17 (ref 4–21)
CO2: 30 — AB (ref 13–22)
Chloride: 94 — AB (ref 99–108)
Creatinine: 1.3 — AB (ref 0.5–1.1)
Potassium: 4 (ref 3.4–5.3)
Sodium: 128 — AB (ref 137–147)

## 2020-05-26 LAB — VITAMIN D 25 HYDROXY (VIT D DEFICIENCY, FRACTURES): Vit D, 25-Hydroxy: 46.4

## 2020-05-26 LAB — HEPATIC FUNCTION PANEL
ALT: 11 (ref 7–35)
AST: 17 (ref 13–35)
Alkaline Phosphatase: 46 (ref 25–125)
Bilirubin, Total: 0.8

## 2020-06-16 ENCOUNTER — Ambulatory Visit: Payer: Self-pay | Admitting: Family Medicine

## 2020-06-24 NOTE — Progress Notes (Signed)
Subjective:   Gloria Mosley is a 86 y.o. female who presents for an Initial Medicare Annual Wellness Visit.  I connected with Chipper Oman today by telephone and verified that I am speaking with the correct person using two identifiers. Location patient: home Location provider: work Persons participating in the virtual visit: patient, provider.   I discussed the limitations, risks, security and privacy concerns of performing an evaluation and management service by telephone and the availability of in person appointments. I also discussed with the patient that there may be a patient responsible charge related to this service. The patient expressed understanding and verbally consented to this telephonic visit.    Interactive audio and video telecommunications were attempted between this provider and patient, however failed, due to patient having technical difficulties OR patient did not have access to video capability.  We continued and completed visit with audio only.   Review of Systems    N/A  Cardiac Risk Factors include: advanced age (>58men, >67 women);dyslipidemia;hypertension     Objective:    There were no vitals filed for this visit. There is no height or weight on file to calculate BMI.  Advanced Directives 06/25/2020 04/25/2015  Does Patient Have a Medical Advance Directive? No No  Would patient like information on creating a medical advance directive? No - Patient declined No - patient declined information    Current Medications (verified) Outpatient Encounter Medications as of 06/25/2020  Medication Sig  . atorvastatin (LIPITOR) 10 MG tablet TAKE ONE TABLET BY MOUTH EVERY OTHER DAY  . bisoprolol-hydrochlorothiazide (ZIAC) 5-6.25 MG tablet Take 1 tablet by mouth daily.  . Cholecalciferol 50 MCG (2000 UT) CAPS Take 2,000 Units by mouth daily.   Marland Kitchen denosumab (PROLIA) 60 MG/ML SOSY injection Inject 60 mg into the skin every 6 (six) months.  . fluticasone (FLONASE) 50 MCG/ACT nasal  spray Place 2 sprays into both nostrils daily.  . raloxifene (EVISTA) 60 MG tablet Take 1 tablet (60 mg total) by mouth daily.  . risperiDONE (RISPERDAL) 0.5 MG tablet TAKE ONE TABLET BY MOUTH EVERY NIGHT AT BEDTIME  . spironolactone (ALDACTONE) 25 MG tablet Take 1 tablet (25 mg total) by mouth daily.   No facility-administered encounter medications on file as of 06/25/2020.    Allergies (verified) Alendronate and Sulfa antibiotics   History: Past Medical History:  Diagnosis Date  . Anxiety   . Arthritis   . Hyperlipidemia   . Hypertension   . Osteoporosis    Past Surgical History:  Procedure Laterality Date  . CHOLECYSTECTOMY  1999   Surgeon: Dr. Michela Pitcher  . Repair Dupuytren's Contracture  2011   Dr. Katrinka Blazing   Family History  Problem Relation Age of Onset  . Diabetes Mother        type 2  . Alzheimer's disease Father   . Lung cancer Sister   . Lung cancer Brother   . Lung cancer Brother   . Lung cancer Brother   . Lung cancer Brother   . Lung cancer Brother   . Kidney cancer Neg Hx   . Bladder Cancer Neg Hx    Social History   Socioeconomic History  . Marital status: Widowed    Spouse name: Not on file  . Number of children: 4  . Years of education: Not on file  . Highest education level: 9th grade  Occupational History  . Occupation: Homemaker  Tobacco Use  . Smoking status: Former Smoker    Packs/day: 1.00    Types: Cigarettes  Quit date: 10/24/1950    Years since quitting: 69.7  . Smokeless tobacco: Never Used  Vaping Use  . Vaping Use: Never used  Substance and Sexual Activity  . Alcohol use: No    Alcohol/week: 0.0 standard drinks  . Drug use: No  . Sexual activity: Not on file  Other Topics Concern  . Not on file  Social History Narrative  . Not on file   Social Determinants of Health   Financial Resource Strain: Low Risk   . Difficulty of Paying Living Expenses: Not hard at all  Food Insecurity: No Food Insecurity  . Worried About Patent examiner in the Last Year: Never true  . Ran Out of Food in the Last Year: Never true  Transportation Needs: No Transportation Needs  . Lack of Transportation (Medical): No  . Lack of Transportation (Non-Medical): No  Physical Activity: Inactive  . Days of Exercise per Week: 0 days  . Minutes of Exercise per Session: 0 min  Stress: No Stress Concern Present  . Feeling of Stress : Not at all  Social Connections: Socially Isolated  . Frequency of Communication with Friends and Family: Never  . Frequency of Social Gatherings with Friends and Family: More than three times a week  . Attends Religious Services: Never  . Active Member of Clubs or Organizations: No  . Attends Banker Meetings: Never  . Marital Status: Widowed    Tobacco Counseling Counseling given: Not Answered   Clinical Intake:  Pre-visit preparation completed: Yes  Pain : No/denies pain     Nutritional Risks: None Diabetes: No  How often do you need to have someone help you when you read instructions, pamphlets, or other written materials from your doctor or pharmacy?: 1 - Never  Diabetic? No  Interpreter Needed?: No  Information entered by :: [facility], LPN   Activities of Daily Living In your present state of health, do you have any difficulty performing the following activities: 06/25/2020 12/25/2019  Hearing? Y Y  Comment Does not wear hearing aids. -  Vision? N N  Difficulty concentrating or making decisions? Y Y  Comment Due to dementia. -  Walking or climbing stairs? Y Y  Comment Avoids steps completely. -  Dressing or bathing? N N  Doing errands, shopping? Y Y  Comment Does not drive. -  Preparing Food and eating ? Y -  Comment Does not cook. -  Using the Toilet? N -  In the past six months, have you accidently leaked urine? N -  Do you have problems with loss of bowel control? Y -  Comment Wears protection daily. -  Managing your Medications? N -  Managing your Finances? Y -    Comment Son manages finances. -  Housekeeping or managing your Housekeeping? Y -  Comment Son manages housework. -  Some recent data might be hidden    Patient Care Team: Malva Limes, MD as PCP - General (Family Medicine) Schermerhorn, Ihor Austin, MD as Referring Physician (Obstetrics and Gynecology) Naoma Diener, NP as Nurse Practitioner (Neurology) Otelia Sergeant, NP as Nurse Practitioner (Surgery)  Indicate any recent Medical Services you may have received from other than [facility] providers in the past year (date may be approximate).     Assessment:   This is a routine wellness examination for Athleen.  Hearing/Vision screen No exam data present  Dietary issues and exercise activities discussed: Current Exercise Habits: The patient does not participate in regular exercise  at present, Exercise limited by: psychological condition(s);Other - see comments (Due to weakness.)  Goals    . DIET - INCREASE WATER INTAKE     Recommend to drink at least 6-8 8oz glasses of water per day.      Depression Screen PHQ 2/9 Scores 06/25/2020 12/25/2019 08/02/2016  PHQ - 2 Score 0 1 0  PHQ- 9 Score - 4 0    Fall Risk Fall Risk  06/25/2020 12/25/2019 08/02/2016  Falls in the past year? 0 0 No  Number falls in past yr: 0 0 -  Injury with Fall? 0 0 -  Follow up - Falls evaluation completed -    Any stairs in or around the home? No  If so, are there any without handrails? No  Home free of loose throw rugs in walkways, pet beds, electrical cords, etc? Yes  Adequate lighting in your home to reduce risk of falls? Yes   ASSISTIVE DEVICES UTILIZED TO PREVENT FALLS:  Life alert? No  Use of a cane, walker or w/c? No  Grab bars in the bathroom? Yes  Shower chair or bench in shower? No  Elevated toilet seat or a handicapped toilet? No    Cognitive Function: Unable to complete today due to speaking with daughter.        Immunizations Immunization History  Administered Date(s)  Administered  . Fluad Quad(high Dose 65+) 07/16/2019  . Influenza Split 08/19/2006, 07/15/2008, 07/29/2009, 08/02/2010, 09/09/2011, 08/06/2012  . Influenza, High Dose Seasonal PF 07/22/2014, 07/22/2015, 08/02/2016, 08/16/2017, 08/29/2018  . Influenza,inj,Quad PF,6+ Mos 07/10/2013  . Pneumococcal Conjugate-13 08/02/2016  . Pneumococcal Polysaccharide-23 10/25/2003  . Td 09/03/1997    TDAP status: Due, Education has been provided regarding the importance of this vaccine. Advised may receive this vaccine at local pharmacy or Health Dept. Aware to provide a copy of the vaccination record if obtained from local pharmacy or Health Dept. Verbalized acceptance and understanding. Flu Vaccine status: Up to date Pneumococcal vaccine status: Up to date Covid-19 vaccine status: Declined, Education has been provided regarding the importance of this vaccine but patient still declined. Advised may receive this vaccine at local pharmacy or Health Dept.or vaccine clinic. Aware to provide a copy of the vaccination record if obtained from local pharmacy or Health Dept. Verbalized acceptance and understanding.  Qualifies for Shingles Vaccine? Yes   Zostavax completed No   Shingrix Completed?: No.    Education has been provided regarding the importance of this vaccine. Patient has been advised to call insurance company to determine out of pocket expense if they have not yet received this vaccine. Advised may also receive vaccine at local pharmacy or Health Dept. Verbalized acceptance and understanding.  Screening Tests Health Maintenance  Topic Date Due  . COVID-19 Vaccine (1) Never done  . INFLUENZA VACCINE  05/24/2020  . TETANUS/TDAP  06/25/2021 (Originally 09/04/2007)  . DEXA SCAN  01/22/2022  . PNA vac Low Risk Adult  Completed    Health Maintenance  Health Maintenance Due  Topic Date Due  . COVID-19 Vaccine (1) Never done  . INFLUENZA VACCINE  05/24/2020    Colorectal cancer screening: No longer  required.  Mammogram status: No longer required.  Bone Density status: Completed 01/23/20. Results reflect: Bone density results: OSTEOPOROSIS. Repeat every 2 years.  Lung Cancer Screening: (Low Dose CT Chest recommended if Age 33-80 years, 30 pack-year currently smoking OR have quit w/in 15years.) does not qualify.    Additional Screening:  Vision Screening: Recommended annual ophthalmology exams for  early detection of glaucoma and other disorders of the eye. Is the patient up to date with their annual eye exam?  Yes  Who is the provider or what is the name of the office in which the patient attends annual eye exams? [facility] If pt is not established with a provider, would they like to be referred to a provider to establish care? No .   Dental Screening: Recommended annual dental exams for proper oral hygiene  Community Resource Referral / Chronic Care Management: CRR required this visit?  No   CCM required this visit?  No      Plan:     I have personally reviewed and noted the following in the patient's chart:   . Medical and social history . Use of alcohol, tobacco or illicit drugs  . Current medications and supplements . Functional ability and status . Nutritional status . Physical activity . Advanced directives . List of other physicians . Hospitalizations, surgeries, and ER visits in previous 12 months . Vitals . Screenings to include cognitive, depression, and falls . Referrals and appointments  In addition, I have reviewed and discussed with patient certain preventive protocols, quality metrics, and best practice recommendations. A written personalized care plan for preventive services as well as general preventive health recommendations were provided to patient.     Miklos Bidinger [place], [place]   03/26/1496   Nurse Notes: Flu shot due this fall. Declined the Covid vaccine at this time.

## 2020-06-25 ENCOUNTER — Ambulatory Visit (INDEPENDENT_AMBULATORY_CARE_PROVIDER_SITE_OTHER): Payer: Medicare Other

## 2020-06-25 ENCOUNTER — Other Ambulatory Visit: Payer: Self-pay

## 2020-06-25 DIAGNOSIS — Z0000 Encounter for general adult medical examination without abnormal findings: Secondary | ICD-10-CM | POA: Diagnosis not present

## 2020-06-25 NOTE — Patient Instructions (Signed)
Gloria Mosley , Thank you for taking time to come for your Medicare Wellness Visit. I appreciate your ongoing commitment to your health goals. Please review the following plan we discussed and let me know if I can assist you in the future.   Screening recommendations/referrals: Colonoscopy: No longer required.  Mammogram: No longer required.  Bone Density: Up to date, due 01/2022 Recommended yearly ophthalmology/optometry visit for glaucoma screening and checkup Recommended yearly dental visit for hygiene and checkup  Vaccinations: Influenza vaccine: Due fall 2021 Pneumococcal vaccine: Completed series Tdap vaccine: Currently due, declined at this time. Shingles vaccine: Shingrix discussed. Please contact your pharmacy for coverage information.   Advanced directives: Advance directive discussed with you today. Even though you declined this today please call our office should you change your mind and we can give you the proper paperwork for you to fill out.  Conditions/risks identified: Recommend to drink at least 6-8 8oz glasses of water per day.  Next appointment: 07/10/20 @ 1:40 AM with Dr Sherrie Mustache. Declined scheduling an AWV for 2022 at this time.   Preventive Care 40 Years and Older, Female Preventive care refers to lifestyle choices and visits with your health care provider that can promote health and wellness. What does preventive care include?  A yearly physical exam. This is also called an annual well check.  Dental exams once or twice a year.  Routine eye exams. Ask your health care provider how often you should have your eyes checked.  Personal lifestyle choices, including:  Daily care of your teeth and gums.  Regular physical activity.  Eating a healthy diet.  Avoiding tobacco and drug use.  Limiting alcohol use.  Practicing safe sex.  Taking low-dose aspirin every day.  Taking vitamin and mineral supplements as recommended by your health care provider. What  happens during an annual well check? The services and screenings done by your health care provider during your annual well check will depend on your age, overall health, lifestyle risk factors, and family history of disease. Counseling  Your health care provider may ask you questions about your:  Alcohol use.  Tobacco use.  Drug use.  Emotional well-being.  Home and relationship well-being.  Sexual activity.  Eating habits.  History of falls.  Memory and ability to understand (cognition).  Work and work Astronomer.  Reproductive health. Screening  You may have the following tests or measurements:  Height, weight, and BMI.  Blood pressure.  Lipid and cholesterol levels. These may be checked every 5 years, or more frequently if you are over 66 years old.  Skin check.  Lung cancer screening. You may have this screening every year starting at age 74 if you have a 30-pack-year history of smoking and currently smoke or have quit within the past 15 years.  Fecal occult blood test (FOBT) of the stool. You may have this test every year starting at age 71.  Flexible sigmoidoscopy or colonoscopy. You may have a sigmoidoscopy every 5 years or a colonoscopy every 10 years starting at age 12.  Hepatitis C blood test.  Hepatitis B blood test.  Sexually transmitted disease (STD) testing.  Diabetes screening. This is done by checking your blood sugar (glucose) after you have not eaten for a while (fasting). You may have this done every 1-3 years.  Bone density scan. This is done to screen for osteoporosis. You may have this done starting at age 3.  Mammogram. This may be done every 1-2 years. Talk to your health care  provider about how often you should have regular mammograms. Talk with your health care provider about your test results, treatment options, and if necessary, the need for more tests. Vaccines  Your health care provider may recommend certain vaccines, such  as:  Influenza vaccine. This is recommended every year.  Tetanus, diphtheria, and acellular pertussis (Tdap, Td) vaccine. You may need a Td booster every 10 years.  Zoster vaccine. You may need this after age 45.  Pneumococcal 13-valent conjugate (PCV13) vaccine. One dose is recommended after age 64.  Pneumococcal polysaccharide (PPSV23) vaccine. One dose is recommended after age 24. Talk to your health care provider about which screenings and vaccines you need and how often you need them. This information is not intended to replace advice given to you by your health care provider. Make sure you discuss any questions you have with your health care provider. Document Released: 11/06/2015 Document Revised: 06/29/2016 Document Reviewed: 08/11/2015 Elsevier Interactive Patient Education  2017 [place] Prevention in the Home Falls can cause injuries. They can happen to people of all ages. There are many things you can do to make your home safe and to help prevent falls. What can I do on the outside of my home?  Regularly fix the edges of walkways and driveways and fix any cracks.  Remove anything that might make you trip as you walk through a door, such as a raised step or threshold.  Trim any bushes or trees on the path to your home.  Use bright outdoor lighting.  Clear any walking paths of anything that might make someone trip, such as rocks or tools.  Regularly check to see if handrails are loose or broken. Make sure that both sides of any steps have handrails.  Any raised decks and porches should have guardrails on the edges.  Have any leaves, snow, or ice cleared regularly.  Use sand or salt on walking paths during winter.  Clean up any spills in your garage right away. This includes oil or grease spills. What can I do in the bathroom?  Use night lights.  Install grab bars by the toilet and in the tub and shower. Do not use towel bars as grab bars.  Use  non-skid mats or decals in the tub or shower.  If you need to sit down in the shower, use a plastic, non-slip stool.  Keep the floor dry. Clean up any water that spills on the floor as soon as it happens.  Remove soap buildup in the tub or shower regularly.  Attach bath mats securely with double-sided non-slip rug tape.  Do not have throw rugs and other things on the floor that can make you trip. What can I do in the bedroom?  Use night lights.  Make sure that you have a light by your bed that is easy to reach.  Do not use any sheets or blankets that are too big for your bed. They should not hang down onto the floor.  Have a firm chair that has side arms. You can use this for support while you get dressed.  Do not have throw rugs and other things on the floor that can make you trip. What can I do in the kitchen?  Clean up any spills right away.  Avoid walking on wet floors.  Keep items that you use a lot in easy-to-reach places.  If you need to reach something above you, use a strong step stool that has a grab bar.  Keep electrical cords out of the way.  Do not use floor polish or wax that makes floors slippery. If you must use wax, use non-skid floor wax.  Do not have throw rugs and other things on the floor that can make you trip. What can I do with my stairs?  Do not leave any items on the stairs.  Make sure that there are handrails on both sides of the stairs and use them. Fix handrails that are broken or loose. Make sure that handrails are as long as the stairways.  Check any carpeting to make sure that it is firmly attached to the stairs. Fix any carpet that is loose or worn.  Avoid having throw rugs at the top or bottom of the stairs. If you do have throw rugs, attach them to the floor with carpet tape.  Make sure that you have a light switch at the top of the stairs and the bottom of the stairs. If you do not have them, ask someone to add them for you. What  else can I do to help prevent falls?  Wear shoes that:  Do not have high heels.  Have rubber bottoms.  Are comfortable and fit you well.  Are closed at the toe. Do not wear sandals.  If you use a stepladder:  Make sure that it is fully opened. Do not climb a closed stepladder.  Make sure that both sides of the stepladder are locked into place.  Ask someone to hold it for you, if possible.  Clearly mark and make sure that you can see:  Any grab bars or handrails.  First and last steps.  Where the edge of each step is.  Use tools that help you move around (mobility aids) if they are needed. These include:  Canes.  Walkers.  Scooters.  Crutches.  Turn on the lights when you go into a dark area. Replace any light bulbs as soon as they burn out.  Set up your furniture so you have a clear path. Avoid moving your furniture around.  If any of your floors are uneven, fix them.  If there are any pets around you, be aware of where they are.  Review your medicines with your doctor. Some medicines can make you feel dizzy. This can increase your chance of falling. Ask your doctor what other things that you can do to help prevent falls. This information is not intended to replace advice given to you by your health care provider. Make sure you discuss any questions you have with your health care provider. Document Released: 08/06/2009 Document Revised: 03/17/2016 Document Reviewed: 11/14/2014 Elsevier Interactive Patient Education  2017 [employer].

## 2020-07-07 ENCOUNTER — Ambulatory Visit: Payer: Self-pay | Admitting: Family Medicine

## 2020-07-07 DIAGNOSIS — R443 Hallucinations, unspecified: Secondary | ICD-10-CM | POA: Diagnosis not present

## 2020-07-07 DIAGNOSIS — M810 Age-related osteoporosis without current pathological fracture: Secondary | ICD-10-CM | POA: Diagnosis not present

## 2020-07-07 DIAGNOSIS — G479 Sleep disorder, unspecified: Secondary | ICD-10-CM | POA: Diagnosis not present

## 2020-07-07 DIAGNOSIS — R413 Other amnesia: Secondary | ICD-10-CM | POA: Diagnosis not present

## 2020-07-10 ENCOUNTER — Other Ambulatory Visit: Payer: Self-pay | Admitting: Neurology

## 2020-07-10 ENCOUNTER — Other Ambulatory Visit: Payer: Self-pay

## 2020-07-10 ENCOUNTER — Ambulatory Visit (INDEPENDENT_AMBULATORY_CARE_PROVIDER_SITE_OTHER): Payer: Medicare Other | Admitting: Family Medicine

## 2020-07-10 ENCOUNTER — Encounter: Payer: Self-pay | Admitting: *Deleted

## 2020-07-10 ENCOUNTER — Encounter: Payer: Self-pay | Admitting: Family Medicine

## 2020-07-10 VITALS — BP 109/69 | HR 61 | Temp 97.9°F | Resp 16 | Wt 105.6 lb

## 2020-07-10 DIAGNOSIS — E611 Iron deficiency: Secondary | ICD-10-CM | POA: Diagnosis not present

## 2020-07-10 DIAGNOSIS — E559 Vitamin D deficiency, unspecified: Secondary | ICD-10-CM

## 2020-07-10 DIAGNOSIS — R1312 Dysphagia, oropharyngeal phase: Secondary | ICD-10-CM

## 2020-07-10 DIAGNOSIS — M810 Age-related osteoporosis without current pathological fracture: Secondary | ICD-10-CM | POA: Diagnosis not present

## 2020-07-10 DIAGNOSIS — E871 Hypo-osmolality and hyponatremia: Secondary | ICD-10-CM

## 2020-07-10 DIAGNOSIS — R531 Weakness: Secondary | ICD-10-CM | POA: Diagnosis not present

## 2020-07-10 DIAGNOSIS — Z23 Encounter for immunization: Secondary | ICD-10-CM

## 2020-07-10 NOTE — Patient Instructions (Addendum)
•   Stop taking bisoprolol-hctz until further notice   Make sure the pharmacy cancels the alendronate and Evista

## 2020-07-10 NOTE — Progress Notes (Signed)
I,April Miller,acting as a scribe for Mila Merry, MD.,have documented all relevant documentation on the behalf of Mila Merry, MD,as directed by  Mila Merry, MD while in the presence of Mila Merry, MD.   Established patient visit   Patient: Gloria Mosley   DOB: January 23, 1933   86 y.o. Female  MRN: [id number] Visit Date: 07/10/2020  Today's healthcare provider: Mila Merry, MD   No chief complaint on file.  Subjective    HPI  Hypertension, follow-up  BP Readings from Last 3 Encounters:  02/07/20 (!) 142/70  12/25/19 (!) 164/72  03/20/18 122/62   Wt Readings from Last 3 Encounters:  02/07/20 111 lb (50.3 kg)  12/25/19 112 lb (50.8 kg)  03/20/18 117 lb (53.1 kg)     She was last seen for hypertension 4 months ago.  BP at that visit was 142/70. Management since that visit includes; Blood pressure improved since addition of Spironolactone. Will recheck labs. She reports good compliance with treatment. She is not having side effects. none She is not exercising. She is adherent to low salt diet.   Outside blood pressures are 127/76.  She does not smoke.  Use of agents associated with hypertension: none.   --------------------------------------------------------------------  Hypokalemia From 02/07/2020-Doing well with addition of Spironolactone. Will recheck labs. Labs checked showing-Sodium level is better, but still a little low. Was 130, is now 133. She did have labs at endocrinologist office last month with sodium of 128 and chloride of 94.   Osteoporosis, senile From 02/07/2020-since then she stopped alendronate due to GI side effects and referred for Prolia which she started earlier this week. She has also stopped Evista. She continues taking vitamin D3 and had vitamin D25-OH=46 when checked at endocrinology last month.    She reports today that she has been feeling increasingly fatigued and weak in her legs for a couple of months, but is much worse in even  the last week. Feels off balance, but has not had a fall so far. No dyspnea. No chest pains, no fevers, no cough, no headache, no other new neuro symptoms. She did she [place] a few days ago for memory issues and hallucinations. Continue on respiridone and ordered barium swallow.      Medications: Outpatient Medications Prior to Visit  Medication Sig  . atorvastatin (LIPITOR) 10 MG tablet TAKE ONE TABLET BY MOUTH EVERY OTHER DAY  . bisoprolol-hydrochlorothiazide (ZIAC) 5-6.25 MG tablet Take 1 tablet by mouth daily.  . Cholecalciferol 50 MCG (2000 UT) CAPS Take 2,000 Units by mouth daily.   Marland Kitchen denosumab (PROLIA) 60 MG/ML SOSY injection Inject 60 mg into the skin every 6 (six) months.  . fluticasone (FLONASE) 50 MCG/ACT nasal spray Place 2 sprays into both nostrils daily.  . raloxifene (EVISTA) 60 MG tablet Take 1 tablet (60 mg total) by mouth daily.  . risperiDONE (RISPERDAL) 0.5 MG tablet TAKE ONE TABLET BY MOUTH EVERY NIGHT AT BEDTIME  . spironolactone (ALDACTONE) 25 MG tablet Take 1 tablet (25 mg total) by mouth daily.   No facility-administered medications prior to visit.    Review of Systems  Constitutional: Negative for appetite change, chills, fatigue and fever.  Respiratory: Negative for chest tightness and shortness of breath.   Cardiovascular: Negative for chest pain and palpitations.  Gastrointestinal: Negative for abdominal pain, nausea and vomiting.  Neurological: Negative for dizziness and weakness.      Objective    BP 109/69 (BP Location: Right Arm, Patient Position: Sitting, Cuff Size: Normal)  Pulse 61   Temp 97.9 F (36.6 C) (Oral)   Resp 16   Wt 105 lb 9.6 oz (47.9 kg)   SpO2 100%   BMI 18.13 kg/m    Physical Exam   General: Appearance:    Thin female in no acute distress, weak appearing. Able to stand slowly but not requiring assistance.   Eyes:    PERRL, conjunctiva/corneas clear, EOM's intact       Lungs:     Clear to auscultation bilaterally,  respirations unlabored  Heart:    Normal heart rate. Normal rhythm. No murmurs, rubs, or gallops.   MS:   All extremities are intact.   Neurologic:   Awake, alert, oriented x 3. No apparent focal neurological           defect.        No results found for any visits on 07/10/20.  Assessment & Plan     1. Weakness She is relatively hypotensive and noted to be hyponatremic on labs done at endocrinology in August. Will put bisoprolol-hctz on hold for the time being. Continue spironolactone for the time being - Comprehensive metabolic panel - CBC - Magnesium - TSH - T4, free  2. Hyponatremia Hold thiazide as above.  - TSH - T4, free  3. Vitamin D deficiency Corrected per labs done at endocrinology last month.   4. Osteoporosis, senile Intolerant to alendronate, started Prolia this week.   5. Need for influenza vaccination Postpone flu vaccine due to current medical ailments.     No follow-ups on file.         Mila Merry, MD  [facility] [phone] (phone) [phone] (fax)  [facility]

## 2020-07-11 LAB — CBC
Hematocrit: 32.4 % — ABNORMAL LOW (ref 34.0–46.6)
Hemoglobin: 10.9 g/dL — ABNORMAL LOW (ref 11.1–15.9)
MCH: 31.5 pg (ref 26.6–33.0)
MCHC: 33.6 g/dL (ref 31.5–35.7)
MCV: 94 fL (ref 79–97)
Platelets: 164 10*3/uL (ref 150–450)
RBC: 3.46 x10E6/uL — ABNORMAL LOW (ref 3.77–5.28)
RDW: 12.2 % (ref 11.7–15.4)
WBC: 7 10*3/uL (ref 3.4–10.8)

## 2020-07-11 LAB — COMPREHENSIVE METABOLIC PANEL
ALT: 12 IU/L (ref 0–32)
AST: 18 IU/L (ref 0–40)
Albumin/Globulin Ratio: 1.4 (ref 1.2–2.2)
Albumin: 4 g/dL (ref 3.6–4.6)
Alkaline Phosphatase: 59 IU/L (ref 44–121)
BUN/Creatinine Ratio: 11 — ABNORMAL LOW (ref 12–28)
BUN: 16 mg/dL (ref 8–27)
Bilirubin Total: 0.6 mg/dL (ref 0.0–1.2)
CO2: 26 mmol/L (ref 20–29)
Calcium: 8.8 mg/dL (ref 8.7–10.3)
Chloride: 93 mmol/L — ABNORMAL LOW (ref 96–106)
Creatinine, Ser: 1.51 mg/dL — ABNORMAL HIGH (ref 0.57–1.00)
GFR calc Af Amer: 36 mL/min/{1.73_m2} — ABNORMAL LOW (ref >59)
GFR calc non Af Amer: 31 mL/min/{1.73_m2} — ABNORMAL LOW (ref >59)
Globulin, Total: 2.9 g/dL (ref 1.5–4.5)
Glucose: 87 mg/dL (ref 65–99)
Potassium: 3.8 mmol/L (ref 3.5–5.2)
Sodium: 131 mmol/L — ABNORMAL LOW (ref 134–144)
Total Protein: 6.9 g/dL (ref 6.0–8.5)

## 2020-07-11 LAB — MAGNESIUM: Magnesium: 1.8 mg/dL (ref 1.6–2.3)

## 2020-07-11 LAB — T4, FREE: Free T4: 1.3 ng/dL (ref 0.82–1.77)

## 2020-07-11 LAB — TSH: TSH: 2.44 u[IU]/mL (ref 0.450–4.500)

## 2020-07-15 ENCOUNTER — Encounter: Payer: Self-pay | Admitting: Family Medicine

## 2020-07-15 ENCOUNTER — Other Ambulatory Visit: Payer: Self-pay | Admitting: Family Medicine

## 2020-07-15 DIAGNOSIS — N1832 Chronic kidney disease, stage 3b: Secondary | ICD-10-CM

## 2020-07-15 DIAGNOSIS — D519 Vitamin B12 deficiency anemia, unspecified: Secondary | ICD-10-CM | POA: Insufficient documentation

## 2020-07-21 LAB — FERRITIN: Ferritin: 161 ng/mL — ABNORMAL HIGH (ref 15–150)

## 2020-07-21 LAB — VITAMIN B12: Vitamin B-12: 226 pg/mL — ABNORMAL LOW (ref 232–1245)

## 2020-07-21 LAB — IRON AND TIBC
Iron Saturation: 17 % (ref 15–55)
Iron: 53 ug/dL (ref 27–139)
Total Iron Binding Capacity: 304 ug/dL (ref 250–450)
UIBC: 251 ug/dL (ref 118–369)

## 2020-07-21 LAB — SPECIMEN STATUS REPORT

## 2020-08-10 NOTE — Progress Notes (Signed)
Established patient visit   Patient: Gloria Mosley   DOB: 1932-11-11   86 y.o. Female  MRN: [id number] Visit Date: 08/11/2020  Today's healthcare provider: [name], MD   Chief Complaint  Patient presents with  . Follow-up   Subjective    HPI  Follow up for blood count, Vitamin B12, gait/balance:  The patient was last seen for this 4 weeks ago. Changes made at last visit include advised to hold Atorvastatin and start OTC vitamin B12 1000 units daily. Bisoprolol-hctz was also put on hold.   She reports good compliance with treatment. She feels that condition is Worse. Patient's daughters states that patient seems weaker since last office visit. Having trouble with balance, but not had any falls.was referred to nephrology after last visit due to drop in eGFR and hyponatremia. She has appt on 08-27-2020 She is not having side effects.   -----------------------------------------------------------------------------------------    Medications: Outpatient Medications Prior to Visit  Medication Sig  . denosumab (PROLIA) 60 MG/ML SOSY injection Inject 60 mg into the skin every 6 (six) months.  . fluticasone (FLONASE) 50 MCG/ACT nasal spray Place 2 sprays into both nostrils daily.  . risperiDONE (RISPERDAL) 0.5 MG tablet TAKE ONE TABLET BY MOUTH EVERY NIGHT AT BEDTIME  . vitamin B-12 (CYANOCOBALAMIN) 1000 MCG tablet Take 1,000 mcg by mouth daily.  Marland Kitchen atorvastatin (LIPITOR) 10 MG tablet TAKE ONE TABLET BY MOUTH EVERY OTHER DAY (Patient not taking: Reported on 08/11/2020)  . Cholecalciferol 50 MCG (2000 UT) CAPS Take 2,000 Units by mouth daily.  (Patient not taking: Reported on 08/11/2020)  . spironolactone (ALDACTONE) 25 MG tablet Take 1 tablet (25 mg total) by mouth daily.   No facility-administered medications prior to visit.    Review of Systems  Constitutional: Positive for fatigue. Negative for appetite change, chills and fever.  Respiratory: Negative for chest tightness  and shortness of breath.   Cardiovascular: Negative for chest pain and palpitations.  Gastrointestinal: Negative for abdominal pain, nausea and vomiting.  Neurological: Positive for weakness. Negative for dizziness.       Gait off balance     Objective    BP 118/75 (BP Location: Left Arm, Patient Position: Sitting)   Pulse 91   Temp 98 F (36.7 C) (Oral)   Resp 16   Wt 105 lb (47.6 kg)   BMI 18.02 kg/m   Physical Exam   General: Appearance:    Thin female in no acute distress  Eyes:    PERRL, conjunctiva/corneas clear, EOM's intact       Lungs:     Clear to auscultation bilaterally, respirations unlabored  Heart:    Normal heart rate. Normal rhythm. No murmurs, rubs, or gallops.   MS:   All extremities are intact.   Neurologic:   Awake, alert, oriented x 3. No apparent focal neurological           defect.         No results found for any visits on 08/11/20.  Assessment & Plan     1. Anemia due to vitamin B12 deficiency, unspecified B12 deficiency type Has been taking oral vitamin b12 consistently for the last month.  - CBC - Vitamin B12  2. Weakness Unclear etiology. No improvement off statin and off of bisoprolol/hctz. Consider physical therapy after reviewing labs.   3. Hyponatremia Bisoprolol-hctz on hold. Has appt scheduled with nephrology the first week of November.   4. Chronic kidney disease, stage 3b [facility]) Seen nephrology in two  week.   5. Hyperlipidemia, unspecified hyperlipidemia type Off statin due to weakness.  - Direct LDL  6. Need for influenza vaccination  - Flu Vaccine QUAD High Dose IM (Fluad)      The entirety of the information documented in the History of Present Illness, Review of Systems and Physical Exam were personally obtained by me. Portions of this information were initially documented by the CMA and reviewed by me for thoroughness and accuracy.      [name], MD  [facility] [phone]  (phone) [phone] (fax)  [place]

## 2020-08-11 ENCOUNTER — Other Ambulatory Visit: Payer: Self-pay

## 2020-08-11 ENCOUNTER — Encounter: Payer: Self-pay | Admitting: Family Medicine

## 2020-08-11 ENCOUNTER — Ambulatory Visit: Payer: Medicare Other | Admitting: Family Medicine

## 2020-08-11 VITALS — BP 118/75 | HR 91 | Temp 98.0°F | Resp 16 | Wt 105.0 lb

## 2020-08-11 DIAGNOSIS — R531 Weakness: Secondary | ICD-10-CM

## 2020-08-11 DIAGNOSIS — Z23 Encounter for immunization: Secondary | ICD-10-CM

## 2020-08-11 DIAGNOSIS — D519 Vitamin B12 deficiency anemia, unspecified: Secondary | ICD-10-CM | POA: Diagnosis not present

## 2020-08-11 DIAGNOSIS — N1832 Chronic kidney disease, stage 3b: Secondary | ICD-10-CM | POA: Diagnosis not present

## 2020-08-11 DIAGNOSIS — E871 Hypo-osmolality and hyponatremia: Secondary | ICD-10-CM | POA: Diagnosis not present

## 2020-08-11 DIAGNOSIS — E785 Hyperlipidemia, unspecified: Secondary | ICD-10-CM | POA: Diagnosis not present

## 2020-08-12 LAB — LDL CHOLESTEROL, DIRECT: LDL Direct: 55 mg/dL (ref 0–99)

## 2020-08-12 LAB — VITAMIN B12: Vitamin B-12: 1428 pg/mL — ABNORMAL HIGH (ref 232–1245)

## 2020-08-14 LAB — SPECIMEN STATUS REPORT

## 2020-08-18 ENCOUNTER — Other Ambulatory Visit: Payer: Self-pay | Admitting: Family Medicine

## 2020-08-18 DIAGNOSIS — I10 Essential (primary) hypertension: Secondary | ICD-10-CM

## 2020-08-27 DIAGNOSIS — N179 Acute kidney failure, unspecified: Secondary | ICD-10-CM | POA: Diagnosis not present

## 2020-08-27 DIAGNOSIS — N1831 Chronic kidney disease, stage 3a: Secondary | ICD-10-CM | POA: Diagnosis not present

## 2020-08-27 DIAGNOSIS — R8290 Unspecified abnormal findings in urine: Secondary | ICD-10-CM | POA: Diagnosis not present

## 2020-08-27 DIAGNOSIS — R809 Proteinuria, unspecified: Secondary | ICD-10-CM | POA: Diagnosis not present

## 2020-08-28 ENCOUNTER — Other Ambulatory Visit: Payer: Self-pay | Admitting: Nephrology

## 2020-08-28 ENCOUNTER — Other Ambulatory Visit (HOSPITAL_COMMUNITY): Payer: Self-pay | Admitting: Nephrology

## 2020-08-28 DIAGNOSIS — N1831 Chronic kidney disease, stage 3a: Secondary | ICD-10-CM

## 2020-08-28 DIAGNOSIS — R82998 Other abnormal findings in urine: Secondary | ICD-10-CM

## 2020-08-28 DIAGNOSIS — R809 Proteinuria, unspecified: Secondary | ICD-10-CM

## 2020-09-03 ENCOUNTER — Ambulatory Visit: Payer: Medicare Other

## 2020-09-15 ENCOUNTER — Ambulatory Visit
Admission: RE | Admit: 2020-09-15 | Discharge: 2020-09-15 | Disposition: A | Discharge status: Home / Self Care | Payer: Medicare Other | Source: Ambulatory Visit | Attending: Nephrology | Admitting: Nephrology

## 2020-09-15 ENCOUNTER — Other Ambulatory Visit: Payer: Self-pay

## 2020-09-15 DIAGNOSIS — N1831 Chronic kidney disease, stage 3a: Secondary | ICD-10-CM | POA: Diagnosis not present

## 2020-09-15 DIAGNOSIS — R82998 Other abnormal findings in urine: Secondary | ICD-10-CM | POA: Insufficient documentation

## 2020-09-15 DIAGNOSIS — R809 Proteinuria, unspecified: Secondary | ICD-10-CM | POA: Insufficient documentation

## 2020-09-15 DIAGNOSIS — N1330 Unspecified hydronephrosis: Secondary | ICD-10-CM | POA: Diagnosis not present

## 2020-09-20 ENCOUNTER — Other Ambulatory Visit: Payer: Self-pay | Admitting: Family Medicine

## 2020-09-20 DIAGNOSIS — I10 Essential (primary) hypertension: Secondary | ICD-10-CM

## 2020-09-24 DIAGNOSIS — N898 Other specified noninflammatory disorders of vagina: Secondary | ICD-10-CM | POA: Diagnosis not present

## 2020-09-24 DIAGNOSIS — E871 Hypo-osmolality and hyponatremia: Secondary | ICD-10-CM | POA: Diagnosis not present

## 2020-09-24 DIAGNOSIS — N1832 Chronic kidney disease, stage 3b: Secondary | ICD-10-CM | POA: Diagnosis not present

## 2020-09-24 DIAGNOSIS — Z4689 Encounter for fitting and adjustment of other specified devices: Secondary | ICD-10-CM | POA: Diagnosis not present

## 2020-12-01 DIAGNOSIS — M810 Age-related osteoporosis without current pathological fracture: Secondary | ICD-10-CM | POA: Diagnosis not present

## 2020-12-01 DIAGNOSIS — E559 Vitamin D deficiency, unspecified: Secondary | ICD-10-CM | POA: Diagnosis not present

## 2020-12-29 ENCOUNTER — Encounter: Payer: Medicare Other | Admitting: Family Medicine

## 2021-01-13 DIAGNOSIS — R441 Visual hallucinations: Secondary | ICD-10-CM | POA: Diagnosis not present

## 2021-01-13 DIAGNOSIS — R413 Other amnesia: Secondary | ICD-10-CM | POA: Diagnosis not present

## 2021-01-13 DIAGNOSIS — M810 Age-related osteoporosis without current pathological fracture: Secondary | ICD-10-CM | POA: Diagnosis not present

## 2021-01-13 DIAGNOSIS — G479 Sleep disorder, unspecified: Secondary | ICD-10-CM | POA: Diagnosis not present

## 2021-01-13 DIAGNOSIS — E538 Deficiency of other specified B group vitamins: Secondary | ICD-10-CM | POA: Diagnosis not present

## 2021-01-19 DIAGNOSIS — N898 Other specified noninflammatory disorders of vagina: Secondary | ICD-10-CM | POA: Diagnosis not present

## 2021-01-19 DIAGNOSIS — Z4689 Encounter for fitting and adjustment of other specified devices: Secondary | ICD-10-CM | POA: Diagnosis not present

## 2021-01-28 DIAGNOSIS — I10 Essential (primary) hypertension: Secondary | ICD-10-CM | POA: Diagnosis not present

## 2021-01-28 DIAGNOSIS — N1832 Chronic kidney disease, stage 3b: Secondary | ICD-10-CM | POA: Diagnosis not present

## 2021-01-28 DIAGNOSIS — E871 Hypo-osmolality and hyponatremia: Secondary | ICD-10-CM | POA: Diagnosis not present

## 2021-01-28 DIAGNOSIS — N1330 Unspecified hydronephrosis: Secondary | ICD-10-CM | POA: Diagnosis not present

## 2021-02-07 NOTE — Progress Notes (Signed)
02/08/2021  1:53 PM   Chipper Oman [birth year] [id number]  Referring provider: Lorain Childes, MD [address] [place],  [place] [address] Chief Complaint  Patient presents with  . Hydronephrosis     HPI: Gloria Mosley is a 87 y.o. female referred by nephrology for follow-up of right hydronephrosis.  Today the patient was accompanied by her daughter.   Seen in our office 2018 for recurrent UTI and microhematuria.  CT urogram showed mild right hydronephrosis consistent with mild UPJ obstruction  Followed by nephrology for CKD and renal ultrasound November 2021 showed moderate to severe right hydronephrosis  No flank, abdominal or pelvic pain  + Urgency with urge incontinence  No dysuria or gross hematuria   PMH: Past Medical History:  Diagnosis Date  . Anxiety   . Arthritis   . Hyperlipidemia   . Hypertension   . Osteoporosis     Surgical History: Past Surgical History:  Procedure Laterality Date  . CHOLECYSTECTOMY  1999   Surgeon: Dr. Michela Pitcher  . Repair Dupuytren's Contracture  2011   Dr. Katrinka Blazing    Home Medications:  Allergies as of 02/08/2021      Reactions   Alendronate    diarrhea   Sulfa Antibiotics    Other reaction(s): Other (See Comments) Does not tolerate well      Medication List       Accurate as of February 08, 2021  1:53 PM. If you have any questions, ask your nurse or doctor.        Cholecalciferol 50 MCG (2000 UT) Caps Take 2,000 Units by mouth daily.   denosumab 60 MG/ML Sosy injection Commonly known as: PROLIA Inject 60 mg into the skin every 6 (six) months.   fluticasone 50 MCG/ACT nasal spray Commonly known as: FLONASE Place 2 sprays into both nostrils daily.   risperiDONE 0.5 MG tablet Commonly known as: RISPERDAL TAKE ONE TABLET BY MOUTH EVERY NIGHT AT BEDTIME   spironolactone 25 MG tablet Commonly known as: ALDACTONE TAKE ONE TABLET BY MOUTH DAILY   vitamin B-12 1000 MCG tablet Commonly known as:  CYANOCOBALAMIN Take 1,000 mcg by mouth daily.       Allergies: Allergies  Allergen Reactions  . Alendronate     diarrhea  . Sulfa Antibiotics     Other reaction(s): Other (See Comments) Does not tolerate well    Family History: Family History  Problem Relation Age of Onset  . Diabetes Mother        type 2  . Alzheimer's disease Father   . Lung cancer Sister   . Lung cancer Brother   . Lung cancer Brother   . Lung cancer Brother   . Lung cancer Brother   . Lung cancer Brother   . Kidney cancer Neg Hx   . Bladder Cancer Neg Hx     Social History:   reports that she quit smoking about 70 years ago. Her smoking use included cigarettes. She smoked 1.00 pack per day. She has never used smokeless tobacco. She reports that she does not drink alcohol and does not use drugs.  ROS: Pertinent ROS in HPI.  Physical Exam: BP 137/82   Pulse 91   Ht 5\' 2"  (1.575 m)   Wt 105 lb (47.6 kg)   BMI 19.20 kg/m   Constitutional:  Alert, No acute distress. HEENT: [place] AT, moist mucus membranes.  Trachea midline, no masses. Cardiovascular: No clubbing, cyanosis, or edema. Respiratory: Normal respiratory effort, no increased work of  breathing. Skin: No rashes, bruises or suspicious lesions.  Laboratory Data:  Lab Results  Component Value Date   CREATININE 1.51 (H) 07/10/2020   CREATININE 1.3 (A) 05/26/2020   CREATININE 1.30 (H) 04/03/2020    Pertinent Imaging:  Renal ultrasound and CT images personally reviewed and interpreted  CLINICAL DATA:  Chronic kidney disease, proteinuria  EXAM: RENAL / URINARY TRACT ULTRASOUND COMPLETE  COMPARISON:  CT 07/14/2017  FINDINGS: Right Kidney:  Renal measurements: 8.8 x 4.1 x 3.5 cm = volume: 65 mL. Moderate to severe right hydronephrosis, similar to prior CT. Overlying cortical thinning and increased echotexture.  Left Kidney:  Renal measurements: 9.5 x 3.9 x 3.6 cm = volume: 69 mL. Mild left hydronephrosis, similar to  prior CT. Increased echotexture.  Bladder:  Appears normal for degree of bladder distention.  Other:  None.  IMPRESSION: Moderate to severe right hydronephrosis and mild left hydronephrosis. Findings are similar to prior CT.  Increased echotexture within the kidneys compatible with chronic medical renal disease.   Electronically Signed   By: Charlett Nose M.D.   On: 09/16/2020 14:21   Assessment & Plan:    1. Right hydronephrosis -Prior CT suggested mild UPJ obstruction -Recent renal ultrasound with worsening hydronephrosis, patient  asymptomatic - Will schedule lasix renogram and call with results   I, Wyn Quaker, am acting as a scribe for Dr. Irineo Axon.   I have reviewed the above documentation for accuracy and completeness, and I agree with the above.    Riki Altes, MD   [facility] [address], [address] [place], [place] [address] [phone]

## 2021-02-08 ENCOUNTER — Other Ambulatory Visit: Payer: Self-pay | Admitting: Urology

## 2021-02-08 ENCOUNTER — Ambulatory Visit: Payer: Medicare Other | Admitting: Urology

## 2021-02-08 ENCOUNTER — Encounter: Payer: Self-pay | Admitting: Urology

## 2021-02-08 ENCOUNTER — Other Ambulatory Visit: Payer: Self-pay

## 2021-02-08 VITALS — BP 137/82 | HR 91 | Ht 62.0 in | Wt 105.0 lb

## 2021-02-08 DIAGNOSIS — N1330 Unspecified hydronephrosis: Secondary | ICD-10-CM

## 2021-02-13 ENCOUNTER — Other Ambulatory Visit: Payer: Self-pay | Admitting: Family Medicine

## 2021-02-13 DIAGNOSIS — I10 Essential (primary) hypertension: Secondary | ICD-10-CM

## 2021-03-10 ENCOUNTER — Telehealth: Payer: Self-pay

## 2021-03-10 ENCOUNTER — Other Ambulatory Visit: Payer: Self-pay

## 2021-03-10 ENCOUNTER — Encounter
Admission: RE | Admit: 2021-03-10 | Discharge: 2021-03-10 | Disposition: A | Discharge status: Home / Self Care | Payer: Medicare Other | Source: Ambulatory Visit | Attending: Urology | Admitting: Urology

## 2021-03-10 DIAGNOSIS — H9193 Unspecified hearing loss, bilateral: Secondary | ICD-10-CM

## 2021-03-10 DIAGNOSIS — N1330 Unspecified hydronephrosis: Secondary | ICD-10-CM | POA: Diagnosis not present

## 2021-03-10 DIAGNOSIS — N19 Unspecified kidney failure: Secondary | ICD-10-CM | POA: Diagnosis not present

## 2021-03-10 MED ORDER — FUROSEMIDE 10 MG/ML IJ SOLN
24.0000 mg | Freq: Once | INTRAMUSCULAR | Status: AC
  Administered 2021-03-10: 24 mg via INTRAVENOUS
  Filled 2021-03-10: qty 2.4

## 2021-03-10 MED ORDER — TECHNETIUM TC 99M MERTIATIDE
5.0000 | Freq: Once | INTRAVENOUS | Status: AC | PRN
  Administered 2021-03-10: 5.17 via INTRAVENOUS

## 2021-03-10 MED ORDER — FUROSEMIDE 10 MG/ML IJ SOLN: 24.0000 mg | Freq: Once | INTRAMUSCULAR | Status: DC

## 2021-03-10 NOTE — Telephone Encounter (Signed)
Copied from CRM [id number]. Topic: Referral - Request for Referral >> Mar 10, 2021 12:37 PM Randol Kern wrote: Reason for CRM: Pt called and is requesting to schedule an appt for the pt. Ear drums may possibly be impacted, hearing has worsened in the last week. Pt's daughter wants referral to ENT, wants to know if PCP must see her first. PCP has seen her for this reason before. Pt's daughter reports signifcant hearing loss.   Best contact: [phone] Victorino Dike, pt's daughter)   Has patient seen PCP for this complaint? Yes.   *If NO, is insurance requiring patient see PCP for this issue before PCP can refer them? Referral for which specialty: ENT  Preferred provider/office:  ENT (locally preferred, does not want to go out of town)  Reason for referral: Significant hearing loss, worsened in the last week

## 2021-03-12 ENCOUNTER — Telehealth: Payer: Self-pay | Admitting: *Deleted

## 2021-03-12 NOTE — Telephone Encounter (Signed)
-----   Message from Riki Altes, MD sent at 03/12/2021  2:51 PM EDT ----- Renal scan showed mild abnormalities.  There is decreased function of the right kidney.  Recommend scheduling follow-up office visit to discuss management options

## 2021-03-12 NOTE — Telephone Encounter (Signed)
Notified patient daughter  as instructed, patient pleased. Discussed follow-up appointments, patient agrees  

## 2021-03-31 ENCOUNTER — Ambulatory Visit: Payer: Medicare Other | Admitting: Urology

## 2021-03-31 ENCOUNTER — Other Ambulatory Visit: Payer: Self-pay

## 2021-03-31 ENCOUNTER — Encounter: Payer: Self-pay | Admitting: Urology

## 2021-03-31 VITALS — BP 138/80 | HR 94 | Ht 60.0 in | Wt 102.0 lb

## 2021-03-31 DIAGNOSIS — Q6211 Congenital occlusion of ureteropelvic junction: Secondary | ICD-10-CM | POA: Diagnosis not present

## 2021-03-31 DIAGNOSIS — N289 Disorder of kidney and ureter, unspecified: Secondary | ICD-10-CM

## 2021-03-31 NOTE — Progress Notes (Signed)
03/31/2021 1:10 PM   Sarely Stracener 05-11-1933 [id number]  Referring provider: Malva Limes, MD [address] [place],  [place] [address]  Chief Complaint  Patient presents with   Other    HPI: 87 y.o. female presents for follow-up of hydronephrosis.  She presents today with her daughter  Refer to my previous note 02/08/2021 Diuretic renogram 03/10/2021 remarkable for 59.5% function left kidney and 40.5% right No significant outflow obstruction defined on the left and felt to be a partial degree of obstruction on the right with a T1/2 10.3 minutes No flank or abdominal pain Denies gross hematuria   PMH: Past Medical History:  Diagnosis Date   Anxiety    Arthritis    Hyperlipidemia    Hypertension    Osteoporosis     Surgical History: Past Surgical History:  Procedure Laterality Date   CHOLECYSTECTOMY  1999   Surgeon: Dr. Michela Pitcher   Repair Dupuytren's Contracture  2011   Dr. Katrinka Blazing    Home Medications:  Allergies as of 03/31/2021       Reactions   Alendronate    diarrhea   Sulfa Antibiotics    Other reaction(s): Other (See Comments) Does not tolerate well        Medication List        Accurate as of March 31, 2021  1:10 PM. If you have any questions, ask your nurse or doctor.          Cholecalciferol 50 MCG (2000 UT) Caps Take 2,000 Units by mouth daily.   denosumab 60 MG/ML Sosy injection Commonly known as: PROLIA Inject 60 mg into the skin every 6 (six) months.   fluticasone 50 MCG/ACT nasal spray Commonly known as: FLONASE Place 2 sprays into both nostrils daily.   risperiDONE 0.5 MG tablet Commonly known as: RISPERDAL TAKE ONE TABLET BY MOUTH EVERY NIGHT AT BEDTIME   spironolactone 25 MG tablet Commonly known as: ALDACTONE TAKE ONE TABLET BY MOUTH DAILY   vitamin B-12 1000 MCG tablet Commonly known as: CYANOCOBALAMIN Take 1,000 mcg by mouth daily.        Allergies:  Allergies  Allergen Reactions   Alendronate      diarrhea   Sulfa Antibiotics     Other reaction(s): Other (See Comments) Does not tolerate well    Family History: Family History  Problem Relation Age of Onset   Diabetes Mother        type 2   Alzheimer's disease Father    Lung cancer Sister    Lung cancer Brother    Lung cancer Brother    Lung cancer Brother    Lung cancer Brother    Lung cancer Brother    Kidney cancer Neg Hx    Bladder Cancer Neg Hx     Social History:  reports that she quit smoking about 70 years ago. Her smoking use included cigarettes. She smoked 1.00 pack per day. She has never used smokeless tobacco. She reports that she does not drink alcohol and does not use drugs.   Physical Exam: BP 138/80   Pulse 94   Ht 5' (1.524 m)   Wt 102 lb (46.3 kg)   BMI 19.92 kg/m   Constitutional:  Alert and oriented, No acute distress. HEENT: [place] AT, moist mucus membranes.  Trachea midline, no masses. Cardiovascular: No clubbing, cyanosis, or edema. Respiratory: Normal respiratory effort, no increased work of breathing.   Assessment & Plan:   87 y.o. female with right hydronephrosis most  likely secondary to UPJ obstruction Diuretic renogram felt to show "probable obstruction" I discussed with her daughter options of observation with a follow-up Lasix renogram in 6 months versus ureteral stent placement.  She was informed that stent placement would require periodic stent changes.  She would like to talk this over with her brothers and indicated she would call back with a decision   Riki Altes, MD  [facility] [address], [address] [place], [place] [address] [phone]

## 2021-04-01 ENCOUNTER — Encounter: Payer: Self-pay | Admitting: Urology

## 2021-04-20 DIAGNOSIS — H6123 Impacted cerumen, bilateral: Secondary | ICD-10-CM | POA: Diagnosis not present

## 2021-04-20 DIAGNOSIS — H903 Sensorineural hearing loss, bilateral: Secondary | ICD-10-CM | POA: Diagnosis not present

## 2021-04-28 ENCOUNTER — Other Ambulatory Visit: Payer: Self-pay | Admitting: Family Medicine

## 2021-04-28 DIAGNOSIS — I10 Essential (primary) hypertension: Secondary | ICD-10-CM

## 2021-04-28 NOTE — Telephone Encounter (Signed)
[facility] faxed refill request for the following medications:   spironolactone (ALDACTONE) 25 MG tablet   Please advise.

## 2021-05-04 ENCOUNTER — Telehealth: Payer: Self-pay

## 2021-05-04 DIAGNOSIS — N1832 Chronic kidney disease, stage 3b: Secondary | ICD-10-CM | POA: Diagnosis not present

## 2021-05-04 DIAGNOSIS — Z4689 Encounter for fitting and adjustment of other specified devices: Secondary | ICD-10-CM | POA: Diagnosis not present

## 2021-05-04 NOTE — Telephone Encounter (Signed)
Opened in error

## 2021-05-04 NOTE — Telephone Encounter (Signed)
Please advise on refill request

## 2021-05-05 MED ORDER — SPIRONOLACTONE 25 MG PO TABS: 25.0000 mg | ORAL_TABLET | Freq: Every day | ORAL | 0 refills | Status: DC

## 2021-05-07 ENCOUNTER — Telehealth: Payer: Self-pay | Admitting: Urology

## 2021-05-07 DIAGNOSIS — Q6211 Congenital occlusion of ureteropelvic junction: Secondary | ICD-10-CM

## 2021-05-07 NOTE — Telephone Encounter (Signed)
Patient's daughter called the office today to report that they have decided to proceed with the lasix renogram in 6 months and a follow up with Dr. Lonna Cobb after the scan.  Please place the order for the scan.

## 2021-05-08 NOTE — Telephone Encounter (Signed)
Order entered for f/u scan in november

## 2021-05-31 ENCOUNTER — Other Ambulatory Visit: Payer: Self-pay | Admitting: Family Medicine

## 2021-05-31 DIAGNOSIS — I10 Essential (primary) hypertension: Secondary | ICD-10-CM

## 2021-05-31 NOTE — Telephone Encounter (Signed)
Medication: spironolactone (ALDACTONE) 25 MG tablet  Has the pt contacted their pharmacy? Yes no refills  Preferred pharmacy: [facility] [id number] - [place], [place] - [address]  Please be advised refills may take up to 3 business days.  We ask that you follow up with your pharmacy.

## 2021-06-01 MED ORDER — SPIRONOLACTONE 25 MG PO TABS: 25.0000 mg | ORAL_TABLET | Freq: Every day | ORAL | 0 refills | Status: DC

## 2021-06-01 NOTE — Telephone Encounter (Signed)
   Notes to clinic:  Patient has appt on 06/30/2021 Review for another refill until appt    Requested Prescriptions  Pending Prescriptions Disp Refills   spironolactone (ALDACTONE) 25 MG tablet 30 tablet 0    Sig: Take 1 tablet (25 mg total) by mouth daily.      Cardiovascular: Diuretics - Aldosterone Antagonist Failed - 06/01/2021  7:27 AM      Failed - Cr in normal range and within 360 days    Creatinine  Date Value Ref Range Status  02/14/2015 1.02 (H) mg/dL Final    Comment:    1.61-0.96 NOTE: New Reference Range  12/30/14    Creatinine, Ser  Date Value Ref Range Status  07/10/2020 1.51 (H) 0.57 - 1.00 mg/dL Final          Failed - Na in normal range and within 360 days    Sodium  Date Value Ref Range Status  07/10/2020 131 (L) 134 - 144 mmol/L Final  02/14/2015 136 mmol/L Final    Comment:    135-145 NOTE: New Reference Range  12/30/14           Failed - Valid encounter within last 6 months    Recent Outpatient Visits           9 months ago Anemia due to vitamin B12 deficiency, unspecified B12 deficiency type   [facility] Malva Limes, MD   10 months ago Weakness   [facility] Malva Limes, MD   1 year ago Hypokalemia   [facility] Malva Limes, MD   1 year ago Essential (primary) hypertension   [facility] Malva Limes, MD   3 years ago Dyspnea on exertion   [facility] Malva Limes, MD       Future Appointments             In 4 weeks Sherrie Mustache, Demetrios Isaacs, MD [facility]             Passed - K in normal range and within 360 days    Potassium  Date Value Ref Range Status  07/10/2020 3.8 3.5 - 5.2 mmol/L Final  02/14/2015 3.8 mmol/L Final    Comment:    3.5-5.1 NOTE: New Reference Range  12/30/14           Passed - Last BP in normal range    BP Readings from Last 1 Encounters:  03/31/21 138/80

## 2021-06-07 ENCOUNTER — Other Ambulatory Visit: Payer: Self-pay | Admitting: Family Medicine

## 2021-06-07 DIAGNOSIS — I10 Essential (primary) hypertension: Secondary | ICD-10-CM

## 2021-06-30 ENCOUNTER — Ambulatory Visit (INDEPENDENT_AMBULATORY_CARE_PROVIDER_SITE_OTHER): Payer: Medicare Other | Admitting: Family Medicine

## 2021-06-30 ENCOUNTER — Other Ambulatory Visit: Payer: Self-pay

## 2021-06-30 ENCOUNTER — Encounter: Payer: Self-pay | Admitting: Family Medicine

## 2021-06-30 VITALS — BP 123/66 | HR 92 | Wt 102.0 lb

## 2021-06-30 DIAGNOSIS — I10 Essential (primary) hypertension: Secondary | ICD-10-CM

## 2021-06-30 DIAGNOSIS — E559 Vitamin D deficiency, unspecified: Secondary | ICD-10-CM | POA: Diagnosis not present

## 2021-06-30 DIAGNOSIS — E785 Hyperlipidemia, unspecified: Secondary | ICD-10-CM | POA: Diagnosis not present

## 2021-06-30 DIAGNOSIS — N1832 Chronic kidney disease, stage 3b: Secondary | ICD-10-CM

## 2021-06-30 DIAGNOSIS — D519 Vitamin B12 deficiency anemia, unspecified: Secondary | ICD-10-CM

## 2021-06-30 DIAGNOSIS — Z23 Encounter for immunization: Secondary | ICD-10-CM

## 2021-06-30 NOTE — Progress Notes (Signed)
Established patient visit   Patient: Gloria Mosley   DOB: [birth year]   87 y.o. Female  MRN: [id number] Visit Date: 06/30/2021  Today's healthcare provider: Mila Merry, MD   Chief Complaint  Patient presents with   Hypertension   Hyperlipidemia   Subjective    HPI  Hypertension, follow-up  BP Readings from Last 3 Encounters:  06/30/21 123/66  03/31/21 138/80  02/08/21 137/82   Wt Readings from Last 3 Encounters:  06/30/21 102 lb (46.3 kg)  03/31/21 102 lb (46.3 kg)  02/08/21 105 lb (47.6 kg)     She was last seen for hypertension 11 months ago.  BP at that visit was 118/75. Management since that visit includes continuing to hold Bisoprolol/HCTZ due to hyponatremia.  She reports excellent compliance with treatment. She is not having side effects.  She is following a Low Sodium diet. She is exercising. She does not smoke.  Use of agents associated with hypertension: none.   Outside blood pressures are not being checked at home.   Lipid/Cholesterol, Follow-up  Last lipid panel Other pertinent labs  Lab Results  Component Value Date   CHOL 138 12/25/2019   HDL 81 12/25/2019   LDLCALC 47 12/25/2019   LDLDIRECT 55 08/11/2020   TRIG 40 12/25/2019   CHOLHDL 1.7 12/25/2019   Lab Results  Component Value Date   ALT 12 07/10/2020   AST 18 07/10/2020   PLT 164 07/10/2020   TSH 2.440 07/10/2020     She was last seen for this 11 months ago.  Management since that visit includes no changes.  She reports excellent compliance with treatment. She is not having side effects.   Symptoms: No chest pain No chest pressure/discomfort  No dyspnea No lower extremity edema  No numbness or tingling of extremity No orthopnea  No palpitations No paroxysmal nocturnal dyspnea  No speech difficulty No syncope     The ASCVD Risk score Denman George DC Jr., et al., 2013) failed to calculate for the following reasons:   The 2013 ASCVD risk score is only valid for ages 63 to  79  ---------------------------------------------------------------------------------------------------     Medications: Outpatient Medications Prior to Visit  Medication Sig   Cholecalciferol 50 MCG (2000 UT) CAPS Take 2,000 Units by mouth daily.   denosumab (PROLIA) 60 MG/ML SOSY injection Inject 60 mg into the skin every 6 (six) months.   fluticasone (FLONASE) 50 MCG/ACT nasal spray Place 2 sprays into both nostrils daily.   risperiDONE (RISPERDAL) 0.5 MG tablet TAKE ONE TABLET BY MOUTH EVERY NIGHT AT BEDTIME   spironolactone (ALDACTONE) 25 MG tablet Take 1 tablet (25 mg total) by mouth daily.   vitamin B-12 (CYANOCOBALAMIN) 1000 MCG tablet Take 1,000 mcg by mouth daily.   No facility-administered medications prior to visit.    Review of Systems  Constitutional: Negative.   Cardiovascular: Negative.   Gastrointestinal: Negative.   Musculoskeletal:  Positive for gait problem. Negative for arthralgias, back pain, joint swelling, myalgias, neck pain and neck stiffness.  Neurological:  Negative for dizziness, seizures, speech difficulty, light-headedness and headaches.      Objective    BP 123/66 (BP Location: Right Arm, Patient Position: Sitting, Cuff Size: Normal)   Pulse 92   Wt 102 lb (46.3 kg)   SpO2 100%   BMI 19.92 kg/m   Physical Exam   General: Appearance:    Thin female in no acute distress  Eyes:    PERRL, conjunctiva/corneas clear, EOM's intact  Lungs:     Clear to auscultation bilaterally, respirations unlabored  Heart:    Normal heart rate. Normal rhythm. No murmurs, rubs, or gallops.    MS:   All extremities are intact.    Neurologic:   Awake, alert, oriented x 3. No apparent focal neurological defect.          Assessment & Plan     1. Essential (primary) hypertension Well controlled.  Continue current medications.    2. Hyperlipidemia, unspecified hyperlipidemia type Diet controlled.   3. Chronic kidney disease, stage 3b (HCC)  -  Parathyroid hormone, intact (no Ca) - Renal function panel  4. Vitamin D deficiency  - VITAMIN D 25 Hydroxy (Vit-D Deficiency, Fractures)  5. Anemia due to vitamin B12 deficiency, unspecified B12 deficiency type  - CBC - Vitamin B12   Future Appointments  Date Time Provider Department Center  08/30/2021 12:00 PM [facility]  10/28/2021 11:15 AM Stoioff, Verna Czech, MD [facility] None  12/31/2021 10:00 AM Malva Limes, MD [facility]         The entirety of the information documented in the History of Present Illness, Review of Systems and Physical Exam were personally obtained by me. Portions of this information were initially documented by the CMA and reviewed by me for thoroughness and accuracy.     Mila Merry, MD  [facility] [phone] (phone) [phone] (fax)  [facility]

## 2021-06-30 NOTE — Addendum Note (Signed)
Addended by: Kavin Leech E on: 06/30/2021 02:24 PM   Modules accepted: Orders

## 2021-07-01 LAB — RENAL FUNCTION PANEL
Albumin: 4.2 g/dL (ref 3.6–4.6)
BUN/Creatinine Ratio: 11 — ABNORMAL LOW (ref 12–28)
BUN: 13 mg/dL (ref 8–27)
CO2: 25 mmol/L (ref 20–29)
Calcium: 8.8 mg/dL (ref 8.7–10.3)
Chloride: 98 mmol/L (ref 96–106)
Creatinine, Ser: 1.19 mg/dL — ABNORMAL HIGH (ref 0.57–1.00)
Glucose: 98 mg/dL (ref 65–99)
Phosphorus: 3 mg/dL (ref 3.0–4.3)
Potassium: 3.8 mmol/L (ref 3.5–5.2)
Sodium: 136 mmol/L (ref 134–144)
eGFR: 45 mL/min/{1.73_m2} — ABNORMAL LOW (ref >59)

## 2021-07-01 LAB — CBC
Hematocrit: 32.5 % — ABNORMAL LOW (ref 34.0–46.6)
Hemoglobin: 10.9 g/dL — ABNORMAL LOW (ref 11.1–15.9)
MCH: 30.9 pg (ref 26.6–33.0)
MCHC: 33.5 g/dL (ref 31.5–35.7)
MCV: 92 fL (ref 79–97)
Platelets: 182 10*3/uL (ref 150–450)
RBC: 3.53 x10E6/uL — ABNORMAL LOW (ref 3.77–5.28)
RDW: 12.2 % (ref 11.7–15.4)
WBC: 5.7 10*3/uL (ref 3.4–10.8)

## 2021-07-01 LAB — VITAMIN B12: Vitamin B-12: >2000 pg/mL — ABNORMAL HIGH (ref 232–1245)

## 2021-07-01 LAB — PARATHYROID HORMONE, INTACT (NO CA): PTH: 37 pg/mL (ref 15–65)

## 2021-07-01 LAB — VITAMIN D 25 HYDROXY (VIT D DEFICIENCY, FRACTURES): Vit D, 25-Hydroxy: 52 ng/mL (ref 30.0–100.0)

## 2021-07-20 DIAGNOSIS — R413 Other amnesia: Secondary | ICD-10-CM | POA: Diagnosis not present

## 2021-07-20 DIAGNOSIS — R441 Visual hallucinations: Secondary | ICD-10-CM | POA: Diagnosis not present

## 2021-07-20 DIAGNOSIS — M810 Age-related osteoporosis without current pathological fracture: Secondary | ICD-10-CM | POA: Diagnosis not present

## 2021-07-20 DIAGNOSIS — F0391 Unspecified dementia with behavioral disturbance: Secondary | ICD-10-CM | POA: Diagnosis not present

## 2021-08-11 DIAGNOSIS — N898 Other specified noninflammatory disorders of vagina: Secondary | ICD-10-CM | POA: Diagnosis not present

## 2021-08-11 DIAGNOSIS — Z4689 Encounter for fitting and adjustment of other specified devices: Secondary | ICD-10-CM | POA: Diagnosis not present

## 2021-08-27 ENCOUNTER — Other Ambulatory Visit: Payer: Self-pay | Admitting: Family Medicine

## 2021-08-27 DIAGNOSIS — I10 Essential (primary) hypertension: Secondary | ICD-10-CM

## 2021-08-30 ENCOUNTER — Telehealth: Payer: Self-pay

## 2021-08-30 ENCOUNTER — Ambulatory Visit: Payer: Medicare Other

## 2021-08-30 NOTE — Telephone Encounter (Signed)
Copied from CRM [id number]. Topic: General - Other >> Aug 30, 2021 11:14 AM Gaetana Michaelis A wrote: Reason for CRM: The patient's daughter has called to request that a member of staff confirm with the patient's pharmacy that they're not continuing to take bisoprolol-hydrochlorothiazide [facility]) 5-6.25 MG tablet   The patient's pharmacy has contacted the patient's son and alerted them that the medication is ready for pickup but the patient's children believed that the patient had stopped the medication    Please contact both the patient's pharmacy and daughter

## 2021-08-30 NOTE — Telephone Encounter (Signed)
Last office visit from 06/30/2021 noted that Bisoprolol/HCTZ was on hold due to hyponatremia. Please advise if this medication is still supposed to be on hold.

## 2021-08-31 NOTE — Telephone Encounter (Signed)
This medication was discontinued over a year ago.

## 2021-09-01 NOTE — Telephone Encounter (Signed)
Pharmacy notified.

## 2021-09-21 ENCOUNTER — Encounter
Admission: RE | Admit: 2021-09-21 | Discharge: 2021-09-21 | Disposition: A | Discharge status: Home / Self Care | Payer: Medicare Other | Source: Ambulatory Visit | Attending: Urology | Admitting: Urology

## 2021-09-21 DIAGNOSIS — N1330 Unspecified hydronephrosis: Secondary | ICD-10-CM | POA: Diagnosis not present

## 2021-09-21 DIAGNOSIS — Q6211 Congenital occlusion of ureteropelvic junction: Secondary | ICD-10-CM | POA: Diagnosis not present

## 2021-09-21 MED ORDER — TECHNETIUM TC 99M MERTIATIDE
5.0000 | Freq: Once | INTRAVENOUS | Status: AC | PRN
  Administered 2021-09-21: 5.31 via INTRAVENOUS

## 2021-09-21 MED ORDER — FUROSEMIDE 10 MG/ML IJ SOLN
23.1500 mg | Freq: Once | INTRAMUSCULAR | Status: AC
Start: 2021-09-21 — End: 2021-09-21
  Administered 2021-09-21: 23.15 mg via INTRAVENOUS
  Filled 2021-09-21 (×2): qty 2.3

## 2021-09-22 ENCOUNTER — Telehealth: Payer: Self-pay | Admitting: *Deleted

## 2021-09-22 NOTE — Telephone Encounter (Signed)
Notified patient as instructed, patient pleased. Discussed follow-up appointments, patient agrees  

## 2021-09-22 NOTE — Telephone Encounter (Signed)
-----   Message from Riki Altes, MD sent at 09/21/2021  7:58 PM EST ----- Renal scan showed stable renal function.  On the repeat scan there was no evidence of obstruction of the right kidney.  It looks like she has a follow-up scheduled January 2023

## 2021-10-06 ENCOUNTER — Ambulatory Visit: Payer: Self-pay | Admitting: Urology

## 2021-10-28 ENCOUNTER — Other Ambulatory Visit: Payer: Self-pay

## 2021-10-28 ENCOUNTER — Ambulatory Visit (INDEPENDENT_AMBULATORY_CARE_PROVIDER_SITE_OTHER): Payer: Medicare Other | Admitting: Urology

## 2021-10-28 DIAGNOSIS — N1330 Unspecified hydronephrosis: Secondary | ICD-10-CM | POA: Diagnosis not present

## 2021-10-28 NOTE — Progress Notes (Signed)
Virtual Visit via Telephone Note  I connected with Gloria Mosley on 10/28/21 at  2:30 PM EST by telephone and verified that I am speaking with the correct person using two identifiers.   Patient location: Home Provider location: [facility] Participants: Patient and daughter   I discussed the limitations, risks, security and privacy concerns of performing an evaluation and management service by telephone and the availability of in person appointments. We discussed the impact of the COVID-19 pandemic on the healthcare system, and the importance of social distancing and reducing patient and provider exposure. I also discussed with the patient that there may be a patient responsible charge related to this service. The patient expressed understanding and agreed to proceed.  Reason for visit: Follow-up hydronephrosis  History of Present Illness: CTU 2018 with mild right hydronephrosis felt consistent with mild UPJ obstruction RUS ordered by nephrology 11/21 with moderate to severe right hydronephrosis Lasix renogram with differential function 59.5% L/40.5% R; partial extraction right kidney Patient elected surveillance Has done well last 6 months without flank/abdominal pain Follow-up Lasix renogram 09/21/2021 with differential function stable at 60% L/40% R; adequate washout right collecting system with no evidence of outflow obstruction  Assessment and Plan: 88 y.o. female with mild right hydronephrosis and recent Lasix renogram showing no obstruction  Follow Up: Continue nephrology follow-up   I discussed the assessment and treatment plan with the patient. The patient was provided an opportunity to ask questions and all were answered. The patient agreed with the plan and demonstrated an understanding of the instructions.   The patient was advised to call back or seek an in-person evaluation if the symptoms worsen or if the condition fails to improve as anticipated.  I  provided 12 minutes of non-face-to-face time during this encounter.   Riki Altes, MD

## 2021-12-21 DIAGNOSIS — M810 Age-related osteoporosis without current pathological fracture: Secondary | ICD-10-CM | POA: Diagnosis not present

## 2021-12-24 DIAGNOSIS — Z4689 Encounter for fitting and adjustment of other specified devices: Secondary | ICD-10-CM | POA: Diagnosis not present

## 2021-12-24 DIAGNOSIS — N898 Other specified noninflammatory disorders of vagina: Secondary | ICD-10-CM | POA: Diagnosis not present

## 2021-12-31 ENCOUNTER — Encounter: Payer: Medicare Other | Admitting: Family Medicine

## 2022-01-19 DIAGNOSIS — F03B18 Unspecified dementia, moderate, with other behavioral disturbance: Secondary | ICD-10-CM | POA: Diagnosis not present

## 2022-01-19 DIAGNOSIS — R441 Visual hallucinations: Secondary | ICD-10-CM | POA: Diagnosis not present

## 2022-01-19 DIAGNOSIS — M810 Age-related osteoporosis without current pathological fracture: Secondary | ICD-10-CM | POA: Diagnosis not present

## 2022-01-19 DIAGNOSIS — R413 Other amnesia: Secondary | ICD-10-CM | POA: Diagnosis not present

## 2022-01-19 DIAGNOSIS — G479 Sleep disorder, unspecified: Secondary | ICD-10-CM | POA: Diagnosis not present

## 2022-01-27 DIAGNOSIS — I10 Essential (primary) hypertension: Secondary | ICD-10-CM | POA: Diagnosis not present

## 2022-01-27 DIAGNOSIS — R809 Proteinuria, unspecified: Secondary | ICD-10-CM | POA: Diagnosis not present

## 2022-01-27 DIAGNOSIS — D631 Anemia in chronic kidney disease: Secondary | ICD-10-CM | POA: Diagnosis not present

## 2022-01-27 DIAGNOSIS — N1832 Chronic kidney disease, stage 3b: Secondary | ICD-10-CM | POA: Diagnosis not present

## 2022-02-04 IMAGING — NM NM RENAL IMAGING FLOW W/ PHARM
4 series · 20 of 20 positions shown · non-contrast
Comparison: 03/10/2021

Cross-sectional imaging correlation: None recent

CLINICAL DATA: Follow-up hydronephrosis and UPJ obstruction RIGHT

EXAM:
NUCLEAR MEDICINE RENAL SCAN WITH DIURETIC ADMINISTRATION
TECHNIQUE: Radionuclide angiographic and sequential renal images were obtained
after intravenous injection of radiopharmaceutical. Imaging was
continued during slow intravenous injection of Lasix approximately
15 minutes after the start of the examination.
RADIOPHARMACEUTICALS:  5.31 mCi Pechnetium-XXm MAG3 IV
Pharmaceutical: Lasix 23.15 mg IV

[Series 1000: lasix renal mag 3 (results) · 7.79mm/px · 6 of 114 frames shown]
[frame 10/114]
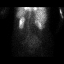
[frame 29/114]
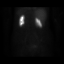
[frame 48/114]
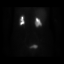
[frame 67/114]
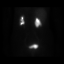
[frame 86/114]
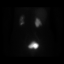
[frame 105/114]
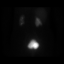

[Series 1000: lasix renal mag 3 (first dynamic renal results) · 7.79mm/px · 6 of 114 frames shown]
[frame 10/114]
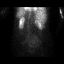
[frame 29/114]
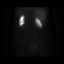
[frame 48/114]
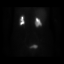
[frame 67/114]
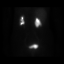
[frame 86/114]
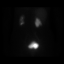
[frame 105/114]
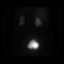

[Series 1000: lasix renal mag 3 · 7.79mm/px · 6 of 114 frames shown]
[frame 10/114]
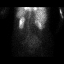
[frame 29/114]
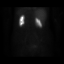
[frame 48/114]
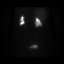
[frame 67/114]
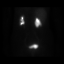
[frame 86/114]
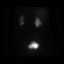
[frame 105/114]
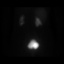

[Series 1000: renal statics · 2.40mm/px · 2 of 2 slices shown]
[im 1/2]
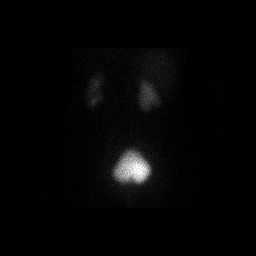
[im 2/2]
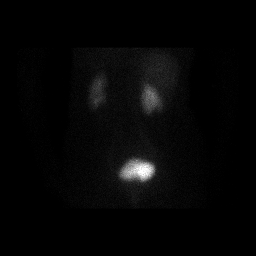

[20 of 20 positions shown; findings below may reference images not displayed]

FINDINGS: Flow:  Prompt symmetric arterial flow to the kidneys.

Left renogram: Normal uptake and concentration of tracer. Excretion
of tracer into minimally prominent renal collecting system similar
to previous exam. Partial clearance of tracer before Lasix but
accelerating following diuretic administration. Minimal retained
tracer at conclusion of exam. Analysis of the renogram curve
demonstrates a delayed time to peak activity of 13 minutes with fall
to half maximum activity at 26.7 minutes.

Right renogram: Normal uptake and concentration of tracer. Excretion
of tracer into a dilated collecting system. Poor clearance of tracer
prior to Lasix. Following Lasix, moderate washout of tracer from the
RIGHT kidney is visualized though mild tracer retention is seen
within the dilated collecting system at the conclusion of the exam.
Analysis of the renogram curve demonstrates a delayed time to peak
activity of 19.5 minutes with fall to half maximum activity at
minutes.

Differential:

Left kidney = 60 %

Right kidney = 40 %

T1/2 post Lasix :

Left kidney = approximately 6 min

Right kidney = 11.5 min
IMPRESSION: Dilated RIGHT renal collecting system though there is good washout
of tracer following Lasix administration indicating an absence of
significant urinary outflow obstruction.

No significant LEFT renogram abnormalities.

Mildly asymmetric renal function as above.

## 2022-03-23 NOTE — Progress Notes (Signed)
I,Gloria Mosley,acting as a scribe for Gloria Huh, MD.,have documented all relevant documentation on the behalf of Gloria Huh, MD,as directed by  Gloria Huh, MD while in the presence of Gloria Huh, MD.   Complete Physical Exam      Patient: Gloria Mosley, Female    DOB: 1932/12/08, 88 y.o.   MRN: [id number] Visit Date: 03/25/2022  Today's Provider: [name], MD   Chief Complaint  Patient presents with   Annual Exam   Hypertension   Hyperlipidemia   Subjective    Gloria Mosley is a 88 y.o. female who presents today for Mosley Annual Wellness Visit. She reports consuming a general diet.  Patient does housework.  She generally feels well. She reports sleeping well. She is also due for follow up multiple chronic medical problems. She is being followed by nephrology for CKD 3b. She is being followed by endocrinology for osteoporosis on Prolia. Mosley daughter reports patient walking gain is getting more unsteady but she has not fallen. She is followed by neurology for dementia and daughter reports this has been stable per neurology evaluations.    Hypertension, follow-up  BP Readings from Last 3 Encounters:  03/25/22 117/61  06/30/21 123/66  03/31/21 138/80   Wt Readings from Last 3 Encounters:  03/25/22 103 lb (46.7 kg)  06/30/21 102 lb (46.3 kg)  03/31/21 102 lb (46.3 kg)     She was last seen for hypertension 8 months ago.  BP at that visit was 123/66. Management since that visit includes continue same medication.  She reports excellent compliance with treatment. She is not having side effects.  She is following a Regular diet. She is not exercising. She does not smoke.  Use of agents associated with hypertension:   Outside blood pressures are: average: does not measure  Symptoms: No chest pain No chest pressure  No palpitations No syncope  No dyspnea No orthopnea  No paroxysmal nocturnal dyspnea No lower extremity edema   Pertinent labs Lab Results   Component Value Date   CHOL 138 12/25/2019   HDL 81 12/25/2019   LDLCALC 47 12/25/2019   LDLDIRECT 55 08/11/2020   TRIG 40 12/25/2019   CHOLHDL 1.7 12/25/2019   Lab Results  Component Value Date   NA 136 06/30/2021   K 3.8 06/30/2021   CREATININE 1.19 (H) 06/30/2021   EGFR 45 (L) 06/30/2021   GLUCOSE 98 06/30/2021   TSH 2.440 07/10/2020     The ASCVD Risk score (Arnett DK, et al., 2019) failed to calculate for the following reasons:   The 2019 ASCVD risk score is only valid for ages 52 to 22  ---------------------------------------------------------------------------------------------------   Lipid/Cholesterol, Follow-up  Last lipid panel Other pertinent labs  Lab Results  Component Value Date   CHOL 138 12/25/2019   HDL 81 12/25/2019   LDLCALC 47 12/25/2019   LDLDIRECT 55 08/11/2020   TRIG 40 12/25/2019   CHOLHDL 1.7 12/25/2019   Lab Results  Component Value Date   ALT 12 07/10/2020   AST 18 07/10/2020   PLT 182 06/30/2021   TSH 2.440 07/10/2020     She was last seen for this 8 months ago.  Management since that visit includes continuing a healthy diet.  Symptoms: No chest pain No chest pressure/discomfort  No dyspnea No lower extremity edema  No numbness or tingling of extremity No orthopnea  No palpitations No paroxysmal nocturnal dyspnea  No speech difficulty No syncope    The ASCVD Risk score (Arnett DK,  et al., 2019) failed to calculate for the following reasons:   The 2019 ASCVD risk score is only valid for ages 35 to 64  ---------------------------------------------------------------------------------------------------    Medications: Outpatient Medications Prior to Visit  Medication Sig   Cholecalciferol 50 MCG (2000 UT) CAPS Take 2,000 Units by mouth daily.   denosumab (PROLIA) 60 MG/ML SOSY injection Inject 60 mg into the skin every 6 (six) months.   fluticasone (FLONASE) 50 MCG/ACT nasal spray Place 2 sprays into both nostrils daily.    risperiDONE (RISPERDAL) 0.5 MG tablet TAKE ONE TABLET BY MOUTH EVERY NIGHT AT BEDTIME   spironolactone (ALDACTONE) 25 MG tablet TAKE ONE TABLET BY MOUTH DAILY   vitamin B-12 (CYANOCOBALAMIN) 1000 MCG tablet Take 1,000 mcg by mouth daily.   No facility-administered medications prior to visit.    Allergies  Allergen Reactions   Alendronate     diarrhea   Sulfa Antibiotics     Other reaction(s): Other (See Comments) Does not tolerate well    Patient Care Team: Gloria Sons, MD as PCP - General (Family Medicine) Schermerhorn, Gloria Her, MD as Referring Physician (Obstetrics and Gynecology) Gloria Son, MD as Consulting Physician (Nephrology) Gloria Carina Betsey Holiday, MD as Physician Assistant (Endocrinology) Gloria Mosley (Physician Assistant)  Review of Systems  Constitutional:  Positive for activity change and appetite change.  HENT:  Positive for hearing loss, postnasal drip and rhinorrhea.   Endocrine: Positive for cold intolerance.  Neurological:  Positive for dizziness, weakness and light-headedness.  Psychiatric/Behavioral:  Positive for confusion and decreased concentration.   All other systems reviewed and are negative.      Objective    Vitals: BP 117/61 (BP Location: Right Arm, Patient Position: Sitting, Cuff Size: Normal)   Pulse 87   Temp 97.9 F (36.6 C) (Oral)   Resp 14   Wt 103 lb (46.7 kg)   SpO2 100%   BMI 20.12 kg/m     Physical Exam   General Appearance:    Well developed, well nourished female. Alert, cooperative, in no acute distress, appears stated age   Head:    Normocephalic, without obvious abnormality, atraumatic  Eyes:    PERRL, conjunctiva/corneas clear, EOM's intact, fundi    benign, both eyes  Ears:    Narrow ear canals. Moderate cerumen L>R, unable to visualize Tms. Very hard of hearing  Nose:   Nares normal, septum midline, mucosa normal, no drainage    or sinus tenderness  Throat:   Lips, mucosa, and tongue normal; teeth and  gums normal  Neck:   Supple, symmetrical, trachea midline, no adenopathy;    thyroid:  no enlargement/tenderness/nodules; no carotid   bruit or JVD  Back:     Symmetric, no curvature, ROM normal, no CVA tenderness  Lungs:     Clear to auscultation bilaterally, respirations unlabored   Heart:    Normal heart rate. Normal rhythm. No murmurs, rubs, or gallops.   Breast Exam:    deferred  Abdomen:     Soft, non-tender, bowel sounds active all four quadrants,    no masses, no organomegaly  Pelvic:    deferred  Extremities:   All extremities are intact. No cyanosis or edema  Pulses:   2+ and symmetric all extremities  Skin:   Skin color, texture, turgor normal, no rashes or lesions  Neurologic:   CNII-XII intact, normal strength, sensation and reflexes    Throughout. Slow to stand with unstable gain, able to get up on exam table with  minimal assistance.        Assessment & Plan     1. Annual physical exam Some trouble with stable gain. Encouraged that she start using quad cane.   2. Essential (primary) hypertension Well controlled.  Continue current medications.    3. Vitamin D deficiency  - VITAMIN D 25 Hydroxy (Vit-D Deficiency, Fractures)  4. Osteoporosis, senile Intolerant to alendronate, but doing well with Prolia adminstered by endocrinology. Daughter states is scheduled for BMD in the fall.   5. Chronic kidney disease, stage 3b ([place]) Continue routine follow up with nephrology.   6. Hyperlipidemia, unspecified hyperlipidemia type  - CBC - Comprehensive metabolic panel - Lipid panel  7. Prescription for Shingrix. Vaccine not administered in office.   - Zoster Vaccine Adjuvanted [facility]) injection; Inject 0.5 mLs into the muscle once for 1 dose. Repeat after 2 months  Dispense: 0.5 mL; Refill: 0  8. Moderate dementia Stable with current management per [facility] neurology.    The entirety of the information documented in the History of Present Illness, Review of Systems  and Physical Exam were personally obtained by me. Portions of this information were initially documented by the CMA and reviewed by me for thoroughness and accuracy.     Gloria Huh, MD  [facility] [phone] (phone) [phone] (fax)  [place]

## 2022-03-25 ENCOUNTER — Ambulatory Visit (INDEPENDENT_AMBULATORY_CARE_PROVIDER_SITE_OTHER): Payer: Medicare Other | Admitting: Family Medicine

## 2022-03-25 ENCOUNTER — Encounter: Payer: Self-pay | Admitting: Family Medicine

## 2022-03-25 VITALS — BP 117/61 | HR 87 | Temp 97.9°F | Resp 14 | Wt 103.0 lb

## 2022-03-25 DIAGNOSIS — N1832 Chronic kidney disease, stage 3b: Secondary | ICD-10-CM

## 2022-03-25 DIAGNOSIS — M810 Age-related osteoporosis without current pathological fracture: Secondary | ICD-10-CM | POA: Diagnosis not present

## 2022-03-25 DIAGNOSIS — E559 Vitamin D deficiency, unspecified: Secondary | ICD-10-CM | POA: Diagnosis not present

## 2022-03-25 DIAGNOSIS — I10 Essential (primary) hypertension: Secondary | ICD-10-CM

## 2022-03-25 DIAGNOSIS — Z289 Immunization not carried out for unspecified reason: Secondary | ICD-10-CM

## 2022-03-25 DIAGNOSIS — Z0000 Encounter for general adult medical examination without abnormal findings: Secondary | ICD-10-CM | POA: Diagnosis not present

## 2022-03-25 DIAGNOSIS — H9193 Unspecified hearing loss, bilateral: Secondary | ICD-10-CM | POA: Diagnosis not present

## 2022-03-25 DIAGNOSIS — E785 Hyperlipidemia, unspecified: Secondary | ICD-10-CM

## 2022-03-25 DIAGNOSIS — F03B0 Unspecified dementia, moderate, without behavioral disturbance, psychotic disturbance, mood disturbance, and anxiety: Secondary | ICD-10-CM | POA: Insufficient documentation

## 2022-03-25 MED ORDER — SHINGRIX 50 MCG/0.5ML IM SUSR: 0.5000 mL | Freq: Once | INTRAMUSCULAR | 0 refills | Status: AC

## 2022-03-25 NOTE — Patient Instructions (Addendum)
.   Please review the attached list of medications and notify my office if there are any errors.   The CDC recommends two doses of Shingrix (the shingles vaccine) separated by 2 to 6 months for adults age 86 years and older. I recommend checking with your pharmacy plan regarding coverage for this vaccine.  .    

## 2022-03-25 NOTE — Progress Notes (Signed)
Annual Wellness Visit     Patient: Gloria Mosley, Female    DOB: [birth year], 88 y.o.   MRN: [id number] Visit Date: 03/25/2022  Today's Provider: Mila Merry, MD    Subjective    Gloria Mosley is a 88 y.o. female who presents today for her Annual Wellness Visit.  Medications: Outpatient Medications Prior to Visit  Medication Sig   Cholecalciferol 50 MCG (2000 UT) CAPS Take 2,000 Units by mouth daily.   denosumab (PROLIA) 60 MG/ML SOSY injection Inject 60 mg into the skin every 6 (six) months.   fluticasone (FLONASE) 50 MCG/ACT nasal spray Place 2 sprays into both nostrils daily.   risperiDONE (RISPERDAL) 0.5 MG tablet TAKE ONE TABLET BY MOUTH EVERY NIGHT AT BEDTIME   spironolactone (ALDACTONE) 25 MG tablet TAKE ONE TABLET BY MOUTH DAILY   vitamin B-12 (CYANOCOBALAMIN) 1000 MCG tablet Take 1,000 mcg by mouth daily.   No facility-administered medications prior to visit.    Allergies  Allergen Reactions   Alendronate     diarrhea   Sulfa Antibiotics     Other reaction(s): Other (See Comments) Does not tolerate well    Patient Care Team: Malva Limes, MD as PCP - General (Family Medicine) Schermerhorn, Ihor Austin, MD as Referring Physician (Obstetrics and Gynecology) Lorain Childes, MD as Consulting Physician (Nephrology) Tedd Sias Marlana Salvage, MD as Physician Assistant (Endocrinology) Donnajean Lopes (Physician Assistant)    Objective     Most recent functional status assessment:    03/25/2022   10:29 AM  In your present state of health, do you have any difficulty performing the following activities:  Hearing? 1  Vision? 1  Difficulty concentrating or making decisions? 0  Walking or climbing stairs? 0  Dressing or bathing? 0  Doing errands, shopping? 0   Most recent fall risk assessment:    03/25/2022   10:28 AM  Fall Risk   Falls in the past year? 0  Number falls in past yr: 0  Injury with Fall? 0  Follow up Falls evaluation completed    Most recent  depression screenings:    03/25/2022   10:28 AM 06/30/2021   10:42 AM  PHQ 2/9 Scores  PHQ - 2 Score 0 1  PHQ- 9 Score 0 9   Most recent cognitive screening:     View : No data to display.         Most recent Audit-C alcohol use screening    03/25/2022   10:29 AM  Alcohol Use Disorder Test (AUDIT)  1. How often do you have a drink containing alcohol? 0  2. How many drinks containing alcohol do you have on a typical day when you are drinking? 0  3. How often do you have six or more drinks on one occasion? 0  AUDIT-C Score 0   A score of 3 or more in women, and 4 or more in men indicates increased risk for alcohol abuse, EXCEPT if all of the points are from question 1   No results found for any visits on 03/25/22.  Assessment & Plan     Annual wellness visit done today including the all of the following: Reviewed patient's Family Medical History Reviewed and updated list of patient's medical providers Assessment of cognitive impairment was done Assessed patient's functional ability Established a written schedule for health screening services Health Risk Assessent Completed and Reviewed  Exercise Activities and Dietary recommendations  Goals      DIET - INCREASE WATER  INTAKE     Recommend to drink at least 6-8 8oz glasses of water per day.        Immunization History  Administered Date(s) Administered   Fluad Quad(high Dose 65+) 07/16/2019, 08/11/2020, 06/30/2021   Influenza Split 08/19/2006, 07/15/2008, 07/29/2009, 08/02/2010, 09/09/2011, 08/06/2012   Influenza, High Dose Seasonal PF 07/22/2014, 07/22/2015, 08/02/2016, 08/16/2017, 08/29/2018   Influenza,inj,Quad PF,6+ Mos 07/10/2013   Pneumococcal Conjugate-13 08/02/2016   Pneumococcal Polysaccharide-23 10/25/2003   Td 09/03/1997    Health Maintenance  Topic Date Due   COVID-19 Vaccine (1) Never done   Zoster Vaccines- Shingrix (1 of 2) Never done   TETANUS/TDAP  09/04/2007   DEXA SCAN  01/22/2022    INFLUENZA VACCINE  05/24/2022   Pneumonia Vaccine 2+ Years old  Completed   HPV VACCINES  Aged Out     Discussed health benefits of physical activity, and encouraged her to engage in regular exercise appropriate for her age and condition.         Mila Merry, MD  [facility] [phone] (phone) [phone] (fax)  [facility]

## 2022-03-26 LAB — COMPREHENSIVE METABOLIC PANEL
ALT: 8 IU/L (ref 0–32)
AST: 15 IU/L (ref 0–40)
Albumin/Globulin Ratio: 1.4 (ref 1.2–2.2)
Albumin: 4.2 g/dL (ref 3.6–4.6)
Alkaline Phosphatase: 71 IU/L (ref 44–121)
BUN/Creatinine Ratio: 14 (ref 12–28)
BUN: 17 mg/dL (ref 8–27)
Bilirubin Total: 0.5 mg/dL (ref 0.0–1.2)
CO2: 23 mmol/L (ref 20–29)
Calcium: 9.2 mg/dL (ref 8.7–10.3)
Chloride: 101 mmol/L (ref 96–106)
Creatinine, Ser: 1.25 mg/dL — ABNORMAL HIGH (ref 0.57–1.00)
Globulin, Total: 3 g/dL (ref 1.5–4.5)
Glucose: 85 mg/dL (ref 70–99)
Potassium: 3.8 mmol/L (ref 3.5–5.2)
Sodium: 139 mmol/L (ref 134–144)
Total Protein: 7.2 g/dL (ref 6.0–8.5)
eGFR: 42 mL/min/{1.73_m2} — ABNORMAL LOW (ref >59)

## 2022-03-26 LAB — LIPID PANEL
Chol/HDL Ratio: 2.5 ratio (ref 0.0–4.4)
Cholesterol, Total: 177 mg/dL (ref 100–199)
HDL: 71 mg/dL (ref >39)
LDL Chol Calc (NIH): 96 mg/dL (ref 0–99)
Triglycerides: 48 mg/dL (ref 0–149)
VLDL Cholesterol Cal: 10 mg/dL (ref 5–40)

## 2022-03-26 LAB — CBC
Hematocrit: 34.8 % (ref 34.0–46.6)
Hemoglobin: 11.4 g/dL (ref 11.1–15.9)
MCH: 30.8 pg (ref 26.6–33.0)
MCHC: 32.8 g/dL (ref 31.5–35.7)
MCV: 94 fL (ref 79–97)
Platelets: 193 10*3/uL (ref 150–450)
RBC: 3.7 x10E6/uL — ABNORMAL LOW (ref 3.77–5.28)
RDW: 12.2 % (ref 11.7–15.4)
WBC: 6.3 10*3/uL (ref 3.4–10.8)

## 2022-03-26 LAB — VITAMIN D 25 HYDROXY (VIT D DEFICIENCY, FRACTURES): Vit D, 25-Hydroxy: 58.4 ng/mL (ref 30.0–100.0)

## 2022-03-31 ENCOUNTER — Other Ambulatory Visit: Payer: Self-pay | Admitting: Internal Medicine

## 2022-03-31 DIAGNOSIS — M810 Age-related osteoporosis without current pathological fracture: Secondary | ICD-10-CM

## 2022-05-09 DIAGNOSIS — H60332 Swimmer's ear, left ear: Secondary | ICD-10-CM | POA: Diagnosis not present

## 2022-05-09 DIAGNOSIS — H6123 Impacted cerumen, bilateral: Secondary | ICD-10-CM | POA: Diagnosis not present

## 2022-05-11 DIAGNOSIS — N898 Other specified noninflammatory disorders of vagina: Secondary | ICD-10-CM | POA: Diagnosis not present

## 2022-05-11 DIAGNOSIS — Z4689 Encounter for fitting and adjustment of other specified devices: Secondary | ICD-10-CM | POA: Diagnosis not present

## 2022-05-25 ENCOUNTER — Other Ambulatory Visit: Payer: Self-pay | Admitting: Internal Medicine

## 2022-05-25 DIAGNOSIS — M810 Age-related osteoporosis without current pathological fracture: Secondary | ICD-10-CM

## 2022-06-02 ENCOUNTER — Emergency Department: Payer: Medicare Other

## 2022-06-02 ENCOUNTER — Encounter: Payer: Self-pay | Admitting: Emergency Medicine

## 2022-06-02 ENCOUNTER — Other Ambulatory Visit: Payer: Self-pay

## 2022-06-02 ENCOUNTER — Ambulatory Visit: Payer: Self-pay | Admitting: *Deleted

## 2022-06-02 DIAGNOSIS — Z79899 Other long term (current) drug therapy: Secondary | ICD-10-CM

## 2022-06-02 DIAGNOSIS — M1990 Unspecified osteoarthritis, unspecified site: Secondary | ICD-10-CM | POA: Diagnosis present

## 2022-06-02 DIAGNOSIS — Z881 Allergy status to other antibiotic agents status: Secondary | ICD-10-CM

## 2022-06-02 DIAGNOSIS — A4189 Other specified sepsis: Principal | ICD-10-CM | POA: Diagnosis present

## 2022-06-02 DIAGNOSIS — J1282 Pneumonia due to coronavirus disease 2019: Secondary | ICD-10-CM | POA: Diagnosis present

## 2022-06-02 DIAGNOSIS — Z515 Encounter for palliative care: Secondary | ICD-10-CM

## 2022-06-02 DIAGNOSIS — G9341 Metabolic encephalopathy: Secondary | ICD-10-CM | POA: Diagnosis present

## 2022-06-02 DIAGNOSIS — I471 Supraventricular tachycardia: Secondary | ICD-10-CM | POA: Diagnosis not present

## 2022-06-02 DIAGNOSIS — D696 Thrombocytopenia, unspecified: Secondary | ICD-10-CM | POA: Diagnosis present

## 2022-06-02 DIAGNOSIS — M810 Age-related osteoporosis without current pathological fracture: Secondary | ICD-10-CM | POA: Diagnosis present

## 2022-06-02 DIAGNOSIS — N1832 Chronic kidney disease, stage 3b: Secondary | ICD-10-CM | POA: Diagnosis present

## 2022-06-02 DIAGNOSIS — Z681 Body mass index (BMI) 19 or less, adult: Secondary | ICD-10-CM

## 2022-06-02 DIAGNOSIS — I248 Other forms of acute ischemic heart disease: Secondary | ICD-10-CM | POA: Diagnosis present

## 2022-06-02 DIAGNOSIS — Z801 Family history of malignant neoplasm of trachea, bronchus and lung: Secondary | ICD-10-CM

## 2022-06-02 DIAGNOSIS — F419 Anxiety disorder, unspecified: Secondary | ICD-10-CM | POA: Diagnosis present

## 2022-06-02 DIAGNOSIS — I129 Hypertensive chronic kidney disease with stage 1 through stage 4 chronic kidney disease, or unspecified chronic kidney disease: Secondary | ICD-10-CM | POA: Diagnosis present

## 2022-06-02 DIAGNOSIS — E559 Vitamin D deficiency, unspecified: Secondary | ICD-10-CM | POA: Diagnosis present

## 2022-06-02 DIAGNOSIS — B9620 Unspecified Escherichia coli [E. coli] as the cause of diseases classified elsewhere: Secondary | ICD-10-CM | POA: Diagnosis present

## 2022-06-02 DIAGNOSIS — Z888 Allergy status to other drugs, medicaments and biological substances status: Secondary | ICD-10-CM

## 2022-06-02 DIAGNOSIS — Z87891 Personal history of nicotine dependence: Secondary | ICD-10-CM

## 2022-06-02 DIAGNOSIS — J150 Pneumonia due to Klebsiella pneumoniae: Secondary | ICD-10-CM | POA: Diagnosis present

## 2022-06-02 DIAGNOSIS — R531 Weakness: Secondary | ICD-10-CM | POA: Diagnosis not present

## 2022-06-02 DIAGNOSIS — Z820 Family history of epilepsy and other diseases of the nervous system: Secondary | ICD-10-CM

## 2022-06-02 DIAGNOSIS — U071 COVID-19: Secondary | ICD-10-CM | POA: Diagnosis present

## 2022-06-02 DIAGNOSIS — N3001 Acute cystitis with hematuria: Secondary | ICD-10-CM | POA: Diagnosis not present

## 2022-06-02 DIAGNOSIS — E785 Hyperlipidemia, unspecified: Secondary | ICD-10-CM | POA: Diagnosis present

## 2022-06-02 DIAGNOSIS — J189 Pneumonia, unspecified organism: Secondary | ICD-10-CM | POA: Diagnosis not present

## 2022-06-02 DIAGNOSIS — F03B0 Unspecified dementia, moderate, without behavioral disturbance, psychotic disturbance, mood disturbance, and anxiety: Secondary | ICD-10-CM | POA: Diagnosis present

## 2022-06-02 DIAGNOSIS — Z9049 Acquired absence of other specified parts of digestive tract: Secondary | ICD-10-CM

## 2022-06-02 DIAGNOSIS — E43 Unspecified severe protein-calorie malnutrition: Secondary | ICD-10-CM | POA: Diagnosis present

## 2022-06-02 DIAGNOSIS — Z833 Family history of diabetes mellitus: Secondary | ICD-10-CM

## 2022-06-02 LAB — COMPREHENSIVE METABOLIC PANEL
ALT: 18 U/L (ref 0–44)
AST: 35 U/L (ref 15–41)
Albumin: 3.7 g/dL (ref 3.5–5.0)
Alkaline Phosphatase: 47 U/L (ref 38–126)
Anion gap: 9 (ref 5–15)
BUN: 35 mg/dL — ABNORMAL HIGH (ref 8–23)
CO2: 26 mmol/L (ref 22–32)
Calcium: 8.9 mg/dL (ref 8.9–10.3)
Chloride: 105 mmol/L (ref 98–111)
Creatinine, Ser: 1.46 mg/dL — ABNORMAL HIGH (ref 0.44–1.00)
GFR, Estimated: 35 mL/min — ABNORMAL LOW (ref >60)
Glucose, Bld: 87 mg/dL (ref 70–99)
Potassium: 4 mmol/L (ref 3.5–5.1)
Sodium: 140 mmol/L (ref 135–145)
Total Bilirubin: 0.7 mg/dL (ref 0.3–1.2)
Total Protein: 7.6 g/dL (ref 6.5–8.1)

## 2022-06-02 LAB — CBC WITH DIFFERENTIAL/PLATELET
Abs Immature Granulocytes: 0.02 10*3/uL (ref 0.00–0.07)
Basophils Absolute: 0 10*3/uL (ref 0.0–0.1)
Basophils Relative: 0 %
Eosinophils Absolute: 0 10*3/uL (ref 0.0–0.5)
Eosinophils Relative: 0 %
HCT: 36.9 % (ref 36.0–46.0)
Hemoglobin: 12 g/dL (ref 12.0–15.0)
Immature Granulocytes: 0 %
Lymphocytes Relative: 22 %
Lymphs Abs: 1.3 10*3/uL (ref 0.7–4.0)
MCH: 30.2 pg (ref 26.0–34.0)
MCHC: 32.5 g/dL (ref 30.0–36.0)
MCV: 92.7 fL (ref 80.0–100.0)
Monocytes Absolute: 0.8 10*3/uL (ref 0.1–1.0)
Monocytes Relative: 13 %
Neutro Abs: 3.8 10*3/uL (ref 1.7–7.7)
Neutrophils Relative %: 65 %
Platelets: 115 10*3/uL — ABNORMAL LOW (ref 150–400)
RBC: 3.98 MIL/uL (ref 3.87–5.11)
RDW: 12.8 % (ref 11.5–15.5)
WBC: 5.9 10*3/uL (ref 4.0–10.5)
nRBC: 0 % (ref 0.0–0.2)

## 2022-06-02 LAB — SARS CORONAVIRUS 2 BY RT PCR: SARS Coronavirus 2 by RT PCR: POSITIVE — AB

## 2022-06-02 LAB — LACTIC ACID, PLASMA: Lactic Acid, Venous: 1.3 mmol/L (ref 0.5–1.9)

## 2022-06-02 NOTE — ED Triage Notes (Signed)
Called patient daughter to get additional information for triage. Poor appetite since Sunday, decreased mobility, shoulder and rib pain, weak, lethargic. COVID positive per home test. Family had EMS check her yesterday, vitals ok at that time. Family is concerned for dehydration. H/o dementia.

## 2022-06-02 NOTE — ED Triage Notes (Signed)
EMS brings pt in from home; +COVID home test; c/o generalized weakness since Sunday; hx dementia

## 2022-06-02 NOTE — Telephone Encounter (Signed)
  Chief Complaint: worsening sx of possible covid sx per daughter DPR Symptoms: cough, shortness of breath, weakness unable to get up stand without assist, not able to eat or drink Frequency: sx started Monday morning 05/30/22 Pertinent Negatives: Patient denies no c/o chest pain.  Disposition: [x] ED /[] Urgent Care (no appt availability in office) / [] Appointment(In office/virtual)/ []  [facility]/ [] Home Care/ [] Refused Recommended Disposition /[] [facility]/ []  Follow-up with PCP Additional Notes:   Patient's daughter requesting PCP call ahead to ED at [facility] and report patient will be coming in due to possible covid sx. Has not been tested but now worsening sx. Please advise. Attempted to contact [facility] and no answer    Reason for Disposition  [1] Drinking very little AND [2] dehydration suspected (e.g., no urine > 12 hours, very dry mouth, very lightheaded)  Answer Assessment - Initial Assessment Questions 1. COVID-19 DIAGNOSIS: "How do you know that you have COVID?" (e.g., positive lab test or self-test, diagnosed by doctor or NP/PA, symptoms after exposure).     Has not tested at this time  2. COVID-19 EXPOSURE: "Was there any known exposure to COVID before the symptoms began?" CDC Definition of close contact: within 6 feet (2 meters) for a total of 15 minutes or more over a 24-hour period.      Yes exposed to son per DPR, daughter 3. ONSET: "When did the COVID-19 symptoms start?"      Monday 05/30/22 4. WORST SYMPTOM: "What is your worst symptom?" (e.g., cough, fever, shortness of breath, muscle aches)     weakness 5. COUGH: "Do you have a cough?" If Yes, ask: "How bad is the cough?"       Yes  6. FEVER: "Do you have a fever?" If Yes, ask: "What is your temperature, how was it measured, and when did it start?"     unknown 7. RESPIRATORY STATUS: "Describe your breathing?" (e.g., normal; shortness of breath, wheezing, unable to speak)      Shortness of breath   8. BETTER-SAME-WORSE: "Are you getting better, staying the same or getting worse compared to yesterday?"  If getting worse, ask, "In what way?"     Worse today  9. OTHER SYMPTOMS: "Do you have any other symptoms?"  (e.g., chills, fatigue, headache, loss of smell or taste, muscle pain, sore throat)     Weakness, unable to stand , not eating or drinking, shortness of breath, cough  10. HIGH RISK DISEASE: "Do you have any chronic medical problems?" (e.g., asthma, heart or lung disease, weak immune system, obesity, etc.)       *No Answer* 11. VACCINE: "Have you had the COVID-19 vaccine?" If Yes, ask: "Which one, how many shots, when did you get it?"       na 12. PREGNANCY: "Is there any chance you are pregnant?" "When was your last menstrual period?"       na 13. O2 SATURATION MONITOR:  "Do you use an oxygen saturation monitor (pulse oximeter) at home?" If Yes, ask "What is your reading (oxygen level) today?" "What is your usual oxygen saturation reading?" (e.g., 95%)       na  Protocols used: Coronavirus (COVID-19) Diagnosed or Suspected-A-AH

## 2022-06-03 ENCOUNTER — Other Ambulatory Visit: Payer: Self-pay

## 2022-06-03 ENCOUNTER — Emergency Department: Payer: Medicare Other

## 2022-06-03 ENCOUNTER — Encounter: Payer: Self-pay | Admitting: Internal Medicine

## 2022-06-03 ENCOUNTER — Inpatient Hospital Stay
Admission: EM | Admit: 2022-06-03 | Discharge: 2022-06-05 | DRG: 871 | Disposition: A | Discharge status: Home Health | Payer: Medicare Other | Attending: Internal Medicine | Admitting: Internal Medicine

## 2022-06-03 DIAGNOSIS — J069 Acute upper respiratory infection, unspecified: Secondary | ICD-10-CM | POA: Diagnosis not present

## 2022-06-03 DIAGNOSIS — N3001 Acute cystitis with hematuria: Secondary | ICD-10-CM

## 2022-06-03 DIAGNOSIS — J189 Pneumonia, unspecified organism: Secondary | ICD-10-CM | POA: Diagnosis not present

## 2022-06-03 DIAGNOSIS — U071 COVID-19: Secondary | ICD-10-CM | POA: Diagnosis not present

## 2022-06-03 DIAGNOSIS — E43 Unspecified severe protein-calorie malnutrition: Secondary | ICD-10-CM | POA: Diagnosis present

## 2022-06-03 DIAGNOSIS — Z681 Body mass index (BMI) 19 or less, adult: Secondary | ICD-10-CM | POA: Diagnosis not present

## 2022-06-03 DIAGNOSIS — Z881 Allergy status to other antibiotic agents status: Secondary | ICD-10-CM | POA: Diagnosis not present

## 2022-06-03 DIAGNOSIS — F03B0 Unspecified dementia, moderate, without behavioral disturbance, psychotic disturbance, mood disturbance, and anxiety: Secondary | ICD-10-CM | POA: Diagnosis present

## 2022-06-03 DIAGNOSIS — M810 Age-related osteoporosis without current pathological fracture: Secondary | ICD-10-CM | POA: Diagnosis present

## 2022-06-03 DIAGNOSIS — G9341 Metabolic encephalopathy: Secondary | ICD-10-CM

## 2022-06-03 DIAGNOSIS — N1832 Chronic kidney disease, stage 3b: Secondary | ICD-10-CM

## 2022-06-03 DIAGNOSIS — I248 Other forms of acute ischemic heart disease: Secondary | ICD-10-CM | POA: Diagnosis present

## 2022-06-03 DIAGNOSIS — D696 Thrombocytopenia, unspecified: Secondary | ICD-10-CM | POA: Diagnosis present

## 2022-06-03 DIAGNOSIS — I10 Essential (primary) hypertension: Secondary | ICD-10-CM | POA: Diagnosis not present

## 2022-06-03 DIAGNOSIS — E785 Hyperlipidemia, unspecified: Secondary | ICD-10-CM | POA: Diagnosis present

## 2022-06-03 DIAGNOSIS — A419 Sepsis, unspecified organism: Secondary | ICD-10-CM | POA: Diagnosis not present

## 2022-06-03 DIAGNOSIS — B9620 Unspecified Escherichia coli [E. coli] as the cause of diseases classified elsewhere: Secondary | ICD-10-CM | POA: Diagnosis present

## 2022-06-03 DIAGNOSIS — I471 Supraventricular tachycardia, unspecified: Secondary | ICD-10-CM | POA: Diagnosis present

## 2022-06-03 DIAGNOSIS — Z9049 Acquired absence of other specified parts of digestive tract: Secondary | ICD-10-CM | POA: Diagnosis not present

## 2022-06-03 DIAGNOSIS — N390 Urinary tract infection, site not specified: Secondary | ICD-10-CM | POA: Diagnosis present

## 2022-06-03 DIAGNOSIS — Z515 Encounter for palliative care: Secondary | ICD-10-CM | POA: Diagnosis not present

## 2022-06-03 DIAGNOSIS — N3000 Acute cystitis without hematuria: Secondary | ICD-10-CM

## 2022-06-03 DIAGNOSIS — Z833 Family history of diabetes mellitus: Secondary | ICD-10-CM | POA: Diagnosis not present

## 2022-06-03 DIAGNOSIS — A4189 Other specified sepsis: Secondary | ICD-10-CM | POA: Diagnosis present

## 2022-06-03 DIAGNOSIS — J1282 Pneumonia due to coronavirus disease 2019: Secondary | ICD-10-CM | POA: Diagnosis present

## 2022-06-03 DIAGNOSIS — I129 Hypertensive chronic kidney disease with stage 1 through stage 4 chronic kidney disease, or unspecified chronic kidney disease: Secondary | ICD-10-CM | POA: Diagnosis present

## 2022-06-03 DIAGNOSIS — I5A Non-ischemic myocardial injury (non-traumatic): Secondary | ICD-10-CM | POA: Diagnosis not present

## 2022-06-03 DIAGNOSIS — Z820 Family history of epilepsy and other diseases of the nervous system: Secondary | ICD-10-CM | POA: Diagnosis not present

## 2022-06-03 DIAGNOSIS — Z888 Allergy status to other drugs, medicaments and biological substances status: Secondary | ICD-10-CM | POA: Diagnosis not present

## 2022-06-03 DIAGNOSIS — J150 Pneumonia due to Klebsiella pneumoniae: Secondary | ICD-10-CM | POA: Diagnosis present

## 2022-06-03 DIAGNOSIS — Z87891 Personal history of nicotine dependence: Secondary | ICD-10-CM | POA: Diagnosis not present

## 2022-06-03 LAB — URINALYSIS, COMPLETE (UACMP) WITH MICROSCOPIC
Bilirubin Urine: NEGATIVE
Glucose, UA: NEGATIVE mg/dL
Ketones, ur: NEGATIVE mg/dL
Nitrite: NEGATIVE
Protein, ur: 100 mg/dL — AB
Specific Gravity, Urine: 1.014 (ref 1.005–1.030)
pH: 7 (ref 5.0–8.0)

## 2022-06-03 LAB — LACTIC ACID, PLASMA: Lactic Acid, Venous: 1.3 mmol/L (ref 0.5–1.9)

## 2022-06-03 LAB — LIPID PANEL
Cholesterol: 131 mg/dL (ref 0–200)
HDL: 50 mg/dL (ref >40)
LDL Cholesterol: 68 mg/dL (ref 0–99)
Total CHOL/HDL Ratio: 2.6 RATIO
Triglycerides: 67 mg/dL (ref <150)
VLDL: 13 mg/dL (ref 0–40)

## 2022-06-03 LAB — TECHNOLOGIST SMEAR REVIEW

## 2022-06-03 LAB — BLOOD GAS, VENOUS
Acid-Base Excess: 2.5 mmol/L — ABNORMAL HIGH (ref 0.0–2.0)
Bicarbonate: 29.3 mmol/L — ABNORMAL HIGH (ref 20.0–28.0)
O2 Saturation: 15.9 %
Patient temperature: 37
pCO2, Ven: 53 mmHg (ref 44–60)
pH, Ven: 7.35 (ref 7.25–7.43)
pO2, Ven: <31 mmHg — CL (ref 32–45)

## 2022-06-03 LAB — PROTIME-INR
INR: 1 (ref 0.8–1.2)
Prothrombin Time: 13.2 seconds (ref 11.4–15.2)

## 2022-06-03 LAB — PROCALCITONIN: Procalcitonin: <0.1 ng/mL

## 2022-06-03 LAB — TROPONIN I (HIGH SENSITIVITY)
Troponin I (High Sensitivity): 21 ng/L — ABNORMAL HIGH (ref <18)
Troponin I (High Sensitivity): 62 ng/L — ABNORMAL HIGH (ref <18)
Troponin I (High Sensitivity): 67 ng/L — ABNORMAL HIGH (ref <18)

## 2022-06-03 LAB — TSH: TSH: 2.461 u[IU]/mL (ref 0.350–4.500)

## 2022-06-03 LAB — GLUCOSE, CAPILLARY
Glucose-Capillary: 120 mg/dL — ABNORMAL HIGH (ref 70–99)
Glucose-Capillary: 121 mg/dL — ABNORMAL HIGH (ref 70–99)

## 2022-06-03 LAB — LACTATE DEHYDROGENASE: LDH: 164 U/L (ref 98–192)

## 2022-06-03 LAB — MAGNESIUM: Magnesium: 2.2 mg/dL (ref 1.7–2.4)

## 2022-06-03 LAB — APTT: aPTT: 34 seconds (ref 24–36)

## 2022-06-03 MED ORDER — DEXTROSE 50 % IV SOLN: 50.0000 mL | INTRAVENOUS | Status: DC | PRN

## 2022-06-03 MED ORDER — ONDANSETRON HCL 4 MG/2ML IJ SOLN: 4.0000 mg | Freq: Three times a day (TID) | INTRAMUSCULAR | Status: DC | PRN

## 2022-06-03 MED ORDER — HYDRALAZINE HCL 20 MG/ML IJ SOLN: 5.0000 mg | INTRAMUSCULAR | Status: DC | PRN

## 2022-06-03 MED ORDER — DM-GUAIFENESIN ER 30-600 MG PO TB12
1.0000 | ORAL_TABLET | Freq: Two times a day (BID) | ORAL | Status: DC | PRN
  Administered 2022-06-04: 1 via ORAL
  Filled 2022-06-03: qty 1

## 2022-06-03 MED ORDER — METOPROLOL TARTRATE 5 MG/5ML IV SOLN: 5.0000 mg | INTRAVENOUS | Status: DC

## 2022-06-03 MED ORDER — NIRMATRELVIR/RITONAVIR (PAXLOVID) TABLET (RENAL DOSING)
2.0000 | ORAL_TABLET | Freq: Two times a day (BID) | ORAL | Status: DC
  Filled 2022-06-03: qty 20

## 2022-06-03 MED ORDER — LACTATED RINGERS IV BOLUS
1000.0000 mL | Freq: Once | INTRAVENOUS | Status: AC
  Administered 2022-06-03: 1000 mL via INTRAVENOUS

## 2022-06-03 MED ORDER — ACETAMINOPHEN 650 MG RE SUPP: 650.0000 mg | Freq: Four times a day (QID) | RECTAL | Status: DC | PRN

## 2022-06-03 MED ORDER — ACETAMINOPHEN 325 MG PO TABS
650.0000 mg | ORAL_TABLET | Freq: Four times a day (QID) | ORAL | Status: DC | PRN
  Administered 2022-06-05: 650 mg via ORAL
  Filled 2022-06-03: qty 2

## 2022-06-03 MED ORDER — MOLNUPIRAVIR EUA 200MG CAPSULE
4.0000 | ORAL_CAPSULE | Freq: Two times a day (BID) | ORAL | Status: DC
Start: 2022-06-03 — End: 2022-06-05
  Administered 2022-06-04 – 2022-06-05 (×2): 800 mg via ORAL
  Filled 2022-06-03: qty 4

## 2022-06-03 MED ORDER — ENSURE ENLIVE PO LIQD
237.0000 mL | Freq: Three times a day (TID) | ORAL | Status: DC
  Administered 2022-06-05: 237 mL via ORAL

## 2022-06-03 MED ORDER — SODIUM CHLORIDE 0.9 % IV SOLN
1.0000 g | Freq: Once | INTRAVENOUS | Status: AC
  Administered 2022-06-03: 1 g via INTRAVENOUS
  Filled 2022-06-03: qty 10

## 2022-06-03 MED ORDER — RISPERIDONE 0.5 MG PO TABS
0.5000 mg | ORAL_TABLET | Freq: Every day | ORAL | Status: DC
Start: 2022-06-03 — End: 2022-06-05
  Administered 2022-06-04: 0.5 mg via ORAL
  Filled 2022-06-03 (×2): qty 1

## 2022-06-03 MED ORDER — VITAMIN B-12 1000 MCG PO TABS
1000.0000 ug | ORAL_TABLET | Freq: Every day | ORAL | Status: DC
  Administered 2022-06-05: 1000 ug via ORAL
  Filled 2022-06-03 (×2): qty 1

## 2022-06-03 MED ORDER — VITAMIN D 25 MCG (1000 UNIT) PO TABS
2000.0000 [IU] | ORAL_TABLET | Freq: Every day | ORAL | Status: DC
  Administered 2022-06-05: 2000 [IU] via ORAL
  Filled 2022-06-03: qty 2

## 2022-06-03 MED ORDER — ALBUTEROL SULFATE HFA 108 (90 BASE) MCG/ACT IN AERS: 2.0000 | INHALATION_SPRAY | RESPIRATORY_TRACT | Status: DC | PRN

## 2022-06-03 MED ORDER — METOPROLOL TARTRATE 5 MG/5ML IV SOLN
5.0000 mg | Freq: Once | INTRAVENOUS | Status: AC
  Administered 2022-06-03: 5 mg via INTRAVENOUS
  Filled 2022-06-03: qty 5

## 2022-06-03 MED ORDER — IOHEXOL 350 MG/ML SOLN
75.0000 mL | Freq: Once | INTRAVENOUS | Status: AC | PRN
  Administered 2022-06-03: 75 mL via INTRAVENOUS

## 2022-06-03 MED ORDER — ASPIRIN 81 MG PO TBEC
81.0000 mg | DELAYED_RELEASE_TABLET | Freq: Every day | ORAL | Status: DC
  Filled 2022-06-03: qty 1

## 2022-06-03 MED ORDER — ADULT MULTIVITAMIN W/MINERALS CH
1.0000 | ORAL_TABLET | Freq: Every day | ORAL | Status: DC
  Administered 2022-06-05: 1 via ORAL
  Filled 2022-06-03: qty 1

## 2022-06-03 MED ORDER — DILTIAZEM HCL 25 MG/5ML IV SOLN
5.0000 mg | Freq: Once | INTRAVENOUS | Status: AC
  Administered 2022-06-03: 5 mg via INTRAVENOUS
  Filled 2022-06-03: qty 5

## 2022-06-03 MED ORDER — IPRATROPIUM BROMIDE HFA 17 MCG/ACT IN AERS
2.0000 | INHALATION_SPRAY | Freq: Four times a day (QID) | RESPIRATORY_TRACT | Status: DC
  Administered 2022-06-04 – 2022-06-05 (×3): 2 via RESPIRATORY_TRACT
  Filled 2022-06-03: qty 12.9

## 2022-06-03 MED ORDER — SODIUM CHLORIDE 0.9 % IV SOLN: INTRAVENOUS | Status: DC

## 2022-06-03 MED ORDER — SODIUM CHLORIDE 0.9 % IV SOLN
1.0000 g | INTRAVENOUS | Status: DC
  Administered 2022-06-04 – 2022-06-05 (×2): 1 g via INTRAVENOUS
  Filled 2022-06-03 (×2): qty 1

## 2022-06-03 MED ORDER — METOPROLOL TARTRATE 5 MG/5ML IV SOLN: 2.5000 mg | INTRAVENOUS | Status: DC | PRN

## 2022-06-03 NOTE — ED Provider Notes (Signed)
[facility] Provider Note    Event Date/Time   First MD Initiated Contact with Patient 06/03/22 [id number]     (approximate)   History   Weakness   HPI  Gloria Mosley is a 88 y.o. female with a past medical history of HTN, vitamin D deficiency, osteoporosis and moderate dementia who presents via EMS for evaluation of worsening weakness cough and inability to ambulate with concerns for daughter who I was able to speak with over the phone for an acute COVID infection.  It seems she had a recent exposure from family member.  Patient is minimally verbal on arrival only able to state she is not in acute pain but otherwise not able to contribute to her history.  Per daughter she normally is able to ambulate but has been too weak to walk over the last 24 hours.  She has had very little appetite of last couple days and has had slight cough.  They are not aware of any falls or injuries.  Patient has not been immunized against COVID.  No other history is immediately available on patient presentation.      Physical Exam  Triage Vital Signs: ED Triage Vitals  Enc Vitals Group     BP 06/02/22 1923 125/67     Pulse Rate 06/02/22 1923 89     Resp 06/02/22 1923 17     Temp 06/02/22 1923 99.5 F (37.5 C)     Temp Source 06/02/22 1923 Oral     SpO2 06/02/22 1909 96 %     Weight --      Height --      Head Circumference --      Peak Flow --      Pain Score --      Pain Loc --      Pain Edu? --      Excl. in GC? --     Most recent vital signs: Vitals:   06/03/22 0200 06/03/22 0230  BP: 135/69 120/77  Pulse: 99 94  Resp: (!) 24 20  Temp:    SpO2: 94% 95%    General: Awake, ill-appearing. CV:  Prolonged cap refill in the digits.  2+ radial pulse.  Tachycardic. Resp:  Normal effort.  Minimal rhonchi at the bases.  Otherwise clear bilaterally Abd:  No distention.  Soft. Other:  PERRLA.  EOMI.  Patient is able to move her thumbs on command and her toes but does not  otherwise participating in neuroexam.   ED Results / Procedures / Treatments  Labs (all labs ordered are listed, but only abnormal results are displayed) Labs Reviewed  SARS CORONAVIRUS 2 BY RT PCR - Abnormal; Notable for the following components:      Result Value   SARS Coronavirus 2 by RT PCR POSITIVE (*)    All other components within normal limits  COMPREHENSIVE METABOLIC PANEL - Abnormal; Notable for the following components:   BUN 35 (*)    Creatinine, Ser 1.46 (*)    GFR, Estimated 35 (*)    All other components within normal limits  CBC WITH DIFFERENTIAL/PLATELET - Abnormal; Notable for the following components:   Platelets 115 (*)    All other components within normal limits  URINALYSIS, COMPLETE (UACMP) WITH MICROSCOPIC - Abnormal; Notable for the following components:   Color, Urine YELLOW (*)    APPearance CLOUDY (*)    Hgb urine dipstick MODERATE (*)    Protein, ur 100 (*)    Leukocytes,Ua  LARGE (*)    Bacteria, UA MANY (*)    All other components within normal limits  BLOOD GAS, VENOUS - Abnormal; Notable for the following components:   pO2, Ven <31 (*)    Bicarbonate 29.3 (*)    Acid-Base Excess 2.5 (*)    All other components within normal limits  TROPONIN I (HIGH SENSITIVITY) - Abnormal; Notable for the following components:   Troponin I (High Sensitivity) 21 (*)    All other components within normal limits  URINE CULTURE  LACTIC ACID, PLASMA  LACTIC ACID, PLASMA  APTT  PROTIME-INR  MAGNESIUM  TSH     EKG  Initial EKG remarkable for sinus tachycardia with a ventricular rate of 111, normal axis, otherwise unremarkable intervals without clear evidence of acute ischemia.   Several repeat EKG showing SVT but this is nonsustained for greater than 10 to 15 seconds per RN observing on monitor.  RADIOLOGY  CT head on my interpretation without evidence of acute intracranial hemorrhage, ischemia, edema, mass effect or other clear acute intracranial  process.  I also reviewed radiology interpretation and agree to findings of no acute intracranial process and mild paranasal sinus inflammation with some fluid levels.  Chest reviewed by myself shows no focal consoidation, effusion, edema, pneumothorax or other clear acute thoracic process. I also reviewed radiology interpretation and agree with findings described.  CTA chest my interpretation without evidence of a large PE or clear focal consolidation overt edema or pneumothorax.  I reviewed radiology interpretation and agree with their findings of mild multifocal airway disease consistent with an atypical infection as well as evidence of air trapping and mild CAD.  PROCEDURES:  Critical Care performed: No  .1-3 Lead EKG Interpretation  Performed by: Gilles Chiquito, MD Authorized by: Gilles Chiquito, MD     Interpretation: non-specific     ECG rate assessment: tachycardic     Rhythm: sinus tachycardia     Ectopy: none     Conduction: normal     The patient is on the cardiac monitor to evaluate for evidence of arrhythmia and/or significant heart rate changes.   MEDICATIONS ORDERED IN ED: Medications  cefTRIAXone (ROCEPHIN) 1 g in sodium chloride 0.9 % 100 mL IVPB (1 g Intravenous New Bag/Given 06/03/22 0244)  lactated ringers bolus 1,000 mL (1,000 mLs Intravenous New Bag/Given 06/03/22 0135)  iohexol (OMNIPAQUE) 350 MG/ML injection 75 mL (75 mLs Intravenous Contrast Given 06/03/22 0201)     IMPRESSION / MDM / ASSESSMENT AND PLAN / ED COURSE  I reviewed the triage vital signs and the nursing notes. Patient's presentation is most consistent with acute presentation with potential threat to life or bodily function.                               Differential diagnosis includes, but is not limited to secondary to acute COVID infection, bacterial pneumonia, UTI, dehydration possibly associated with electrolyte derangements or kidney injury.  She is not hypoxic but is tachycardic is  additional considerations include a PE.  She is also much more confused than usual and generally weak without clear focal deficits to suggest a CVA although I will obtain a CT head in addition to a CTA chest to assess for acute intracranial abnormalities contributing to her decreased ability to speak.  Initial EKG remarkable for sinus tachycardia with a ventricular rate of 111, normal axis, otherwise unremarkable intervals without clear evidence of acute ischemia.  Several repeat EKG showing SVT but this is nonsustained for greater than 10 to 15 seconds per RN observing on monitor.  CT head on my interpretation without evidence of acute intracranial hemorrhage, ischemia, edema, mass effect or other clear acute intracranial process.  I also reviewed radiology interpretation and agree to findings of no acute intracranial process and mild paranasal sinus inflammation with some fluid levels.  Chest reviewed by myself shows no focal consoidation, effusion, edema, pneumothorax or other clear acute thoracic process. I also reviewed radiology interpretation and agree with findings described.  CTA chest my interpretation without evidence of a large PE or clear focal consolidation overt edema or pneumothorax.  I reviewed radiology interpretation and agree with their findings of mild multifocal airway disease consistent with an atypical infection as well as evidence of air trapping and mild CAD.  CMP is remarkable for BUN of 35 and a creatinine of 1.46 compared to 1.252 months ago without any other significant electrolyte or metabolic derangements.  CBC without leukocytosis or acute anemia.  Lactic acid is not elevated.  COVID PCR is positive.  TSH WNL.  Magnesium 2.2.  Opponent on arrival is 54.  Suspect mild demand ischemia with lower suspicion for an occlusion MI.  VBG with a pH of 7.35 with a pCO2 of 35 and bicarb of 29.3.  UA does appear infected with large leukocytes raise and many bacteria with 21-50 WBCs.   Urine culture sent and patient started on Rocephin.  I do not think she is septic at this time.  Remdesivir is reordered for COVID.  I suspect she is weak and minimally verbal secondary to acute COVID infection and UTI.  I will admit to medicine service for further evaluation management.      FINAL CLINICAL IMPRESSION(S) / ED DIAGNOSES   Final diagnoses:  COVID  Acute cystitis with hematuria  SVT (supraventricular tachycardia) (HCC)     Rx / DC Orders   ED Discharge Orders          Ordered    remdesivir per pharmacy consult       Provider:  (Not yet assigned)   06/03/22 0127             Note:  This document was prepared using Dragon voice recognition software and may include unintentional dictation errors.   Gilles Chiquito, MD 06/03/22 0300

## 2022-06-03 NOTE — Assessment & Plan Note (Signed)
Patient meets criteria for sepsis with tachycardia and tachypnea with RR 27.  This is due to combination of UTI and COVID infection.  Lactic acid normal 1.3, 1.3.  Procalcitonin negative. Received fluid and started on ceftriaxone for concern of UTI.

## 2022-06-03 NOTE — ED Notes (Signed)
Pt bed alarm continued to alarm. Pt not moving. Bed alarm pad replaced and working properly at this time.

## 2022-06-03 NOTE — Progress Notes (Signed)
While completing admission profile, spoke with daughter who confirmed if her mothers heart were to stop or she were to stop breathing, the "family has agreed" they would want full resuscitation efforts provided. Patient remains full code at this time.

## 2022-06-03 NOTE — Assessment & Plan Note (Signed)
Resolved. Heart rate improved from initial 196 --> 80s.  TSH normal 2.461.  This is likely triggered by ongoing infection -Patient received 5 mg of Cardizem and 5 mg of metoprolol in ED -As needed metoprolol 2.5 mg every 3 hours

## 2022-06-03 NOTE — H&P (Signed)
History and Physical    Gloria Mosley VOJ:[id number] DOB: 18-Jun-1933 DOA: 06/03/2022  Referring MD/NP/PA:   PCP: Malva Limes, MD   Patient coming from:  The patient is coming from home.    Chief Complaint: AMS, cough  HPI: Gloria Mosley is a 88 y.o. female with medical history significant of hypertension, hyperlipidemia, CKD-3B, dementia, osteoporosis, who presents with altered mental status and cough.  Patient has AMS,  and is unable to provide accurate medical history.  I called her son by phone, who provided medical history.  Per his son, at her normal baseline, patient is orientated x 3, interacting with family members.  In the past several days, patient has been confused, less reactive, has poor appetite and decreased oral intake.  Family members has COVID infection.  Patient also developed cough and mild shortness of breath, with little sputum production.  Did not complain of chest pain.  No fever or chills.  Patient also has increased urinary frequency, not sure if patient has dysuria or burning on urination.  No active nausea vomiting or diarrhea noted.  Did not complain abdominal pain for her son.  Patient moves all extremities.  Patient was found to have SVT with heart rate up to 196, which improved to 140s then to 80s after treated with IV 5 mg of Cardizem and 5 mg of metoprolol in ED.  Data reviewed independently and ED Course: pt was found to have positive COVID PCR, positive urinalysis (cloudy appearance, large amount of leukocyte, many bacteria, WBC 21-25), lactic acid 1.3 --> 1.3, INR 1.0, PTT 34, troponin level 21, renal function close to baseline, WBC 5.9, platelet 115, VBG with pH 7.35, CO2 53, O2 <31.  Temperature normal, blood pressure 116/82, RR 27, oxygen saturation 91%, which improved to 98% on 2 L oxygen.  Chest x-ray negative.  CT head negative.  CTA negative for PE, but showed atypical disease.  Patient is admitted to telemetry bed as inpatient.  CTA: 1. No pulmonary  embolism. 2. Mild multifocal airway impaction and tree-in-bud nodularity in keeping with acute infection or chronic atypical infection such as mycobacterial or atypical fungal infection, or aspiration. 3. Asymmetrically extensive airway impaction within the dependent right lower lobe with associated bronchial wall thickening and widespread ground-glass opacity in keeping with aspiration or acute infection. 4. Multifocal air trapping likely related to small airways disease. 5. Mild coronary artery calcification.   Aortic Atherosclerosis (ICD10-I70.0).  EKG: I have personally reviewed.  SVT, QTc 506, heart rate 196, nonspecific T wave change  Review of Systems: Could not be reviewed due to altered mental status.    Allergy:  Allergies  Allergen Reactions   Alendronate     diarrhea   Sulfa Antibiotics     Other reaction(s): Other (See Comments) Does not tolerate well    Past Medical History:  Diagnosis Date   Anxiety    Arthritis    Hyperlipidemia    Hypertension    Osteoporosis     Past Surgical History:  Procedure Laterality Date   CHOLECYSTECTOMY  1999   Surgeon: Dr. Michela Pitcher   Repair Dupuytren's Contracture  2011   Dr. Katrinka Blazing    Social History:  reports that she quit smoking about 71 years ago. Her smoking use included cigarettes. She smoked an average of 1 pack per day. She has never used smokeless tobacco. She reports that she does not drink alcohol and does not use drugs.  Family History:  Family History  Problem Relation Age of Onset  Diabetes Mother        type 2   Alzheimer's disease Father    Lung cancer Sister    Lung cancer Brother    Lung cancer Brother    Lung cancer Brother    Lung cancer Brother    Lung cancer Brother    Kidney cancer Neg Hx    Bladder Cancer Neg Hx      Prior to Admission medications   Medication Sig Start Date End Date Taking? Authorizing Provider  Cholecalciferol 50 MCG (2000 UT) CAPS Take 2,000 Units by mouth daily.   Yes  [provider]  denosumab (PROLIA) 60 MG/ML SOSY injection Inject 60 mg into the skin every 6 (six) months.   Yes [provider]  fluticasone (FLONASE) 50 MCG/ACT nasal spray Place 2 sprays into both nostrils daily. 10/10/16  Yes Malva LimesFisher, Donald E, MD  risperiDONE (RISPERDAL) 0.5 MG tablet TAKE ONE TABLET BY MOUTH EVERY NIGHT AT BEDTIME 04/26/17  Yes Malva LimesFisher, Donald E, MD  spironolactone (ALDACTONE) 25 MG tablet TAKE ONE TABLET BY MOUTH DAILY 08/27/21  Yes Malva LimesFisher, Donald E, MD  vitamin B-12 (CYANOCOBALAMIN) 1000 MCG tablet Take 1,000 mcg by mouth daily.   Yes [provider]    Physical Exam: Vitals:   06/03/22 0830 06/03/22 0900 06/03/22 0930 06/03/22 1200  BP: 118/63 116/67 116/82 122/71  Pulse: 85 88 84 97  Resp: (!) 24 (!) 23 (!) 25 (!) 21  Temp: 98.6 F (37 C)     TempSrc: Oral     SpO2: 98% 98% 98% 94%  Weight:      Height:       General: Not in acute distress HEENT:       Eyes: PERRL, EOMI, no scleral icterus.       ENT: No discharge from the ears and nose       Neck: No JVD, no bruit, no mass felt. Heme: No neck lymph node enlargement. Cardiac: S1/S2, RRR, No murmurs, No gallops or rubs. Respiratory: Has coarse breathing sound bilaterally GI: Soft, nondistended, nontender, no organomegaly, BS present. GU: No hematuria Ext: No pitting leg edema bilaterally. 1+DP/PT pulse bilaterally. Musculoskeletal: No joint deformities, No joint redness or warmth, no limitation of ROM in spin. Skin: No rashes.  Neuro: Confused, barely arousable, not following command, not oriented X3, cranial nerves II-XII grossly intact, moves all extremities Psych: Patient is not psychotic/  Labs on Admission: I have personally reviewed following labs and imaging studies  CBC: Recent Labs  Lab 06/02/22 1925  WBC 5.9  NEUTROABS 3.8  HGB 12.0  HCT 36.9  MCV 92.7  PLT 115*   Basic Metabolic Panel: Recent Labs  Lab 06/02/22 1925 06/02/22 2351  NA 140  --   K 4.0   --   CL 105  --   CO2 26  --   GLUCOSE 87  --   BUN 35*  --   CREATININE 1.46*  --   CALCIUM 8.9  --   MG  --  2.2   GFR: Estimated Creatinine Clearance: 20 mL/min (A) (by C-G formula based on SCr of 1.46 mg/dL (H)). Liver Function Tests: Recent Labs  Lab 06/02/22 1925  AST 35  ALT 18  ALKPHOS 47  BILITOT 0.7  PROT 7.6  ALBUMIN 3.7   No results for input(s): "LIPASE", "AMYLASE" in the last 168 hours. No results for input(s): "AMMONIA" in the last 168 hours. Coagulation Profile: Recent Labs  Lab 06/03/22 0127  INR 1.0  Cardiac Enzymes: No results for input(s): "CKTOTAL", "CKMB", "CKMBINDEX", "TROPONINI" in the last 168 hours. BNP (last 3 results) No results for input(s): "PROBNP" in the last 8760 hours. HbA1C: No results for input(s): "HGBA1C" in the last 72 hours. CBG: No results for input(s): "GLUCAP" in the last 168 hours. Lipid Profile: No results for input(s): "CHOL", "HDL", "LDLCALC", "TRIG", "CHOLHDL", "LDLDIRECT" in the last 72 hours. Thyroid Function Tests: Recent Labs    06/02/22 2351  TSH 2.461   Anemia Panel: No results for input(s): "VITAMINB12", "FOLATE", "FERRITIN", "TIBC", "IRON", "RETICCTPCT" in the last 72 hours. Urine analysis:    Component Value Date/Time   COLORURINE YELLOW (A) 06/03/2022 0127   APPEARANCEUR CLOUDY (A) 06/03/2022 0127   APPEARANCEUR Cloudy (A) 07/25/2017 1033   LABSPEC 1.014 06/03/2022 0127   LABSPEC 1.010 02/14/2015 0930   PHURINE 7.0 06/03/2022 0127   GLUCOSEU NEGATIVE 06/03/2022 0127   GLUCOSEU Negative 02/14/2015 0930   HGBUR MODERATE (A) 06/03/2022 0127   BILIRUBINUR NEGATIVE 06/03/2022 0127   BILIRUBINUR Negative 07/25/2017 1033   BILIRUBINUR Negative 02/14/2015 0930   KETONESUR NEGATIVE 06/03/2022 0127   PROTEINUR 100 (A) 06/03/2022 0127   UROBILINOGEN 0.2 06/07/2017 1118   NITRITE NEGATIVE 06/03/2022 0127   LEUKOCYTESUR LARGE (A) 06/03/2022 0127   LEUKOCYTESUR 3+ 02/14/2015 0930   Sepsis  Labs: @LABRCNTIP (procalcitonin:4,lacticidven:4) ) Recent Results (from the past 240 hour(s))  SARS Coronavirus 2 by RT PCR (hospital order, performed in [facility] hospital lab) *cepheid single result test* Anterior Nasal Swab     Status: Abnormal   Collection Time: 06/02/22  7:28 PM   Specimen: Anterior Nasal Swab  Result Value Ref Range Status   SARS Coronavirus 2 by RT PCR POSITIVE (A) NEGATIVE Final    Comment: (NOTE) SARS-CoV-2 target nucleic acids are DETECTED  SARS-CoV-2 RNA is generally detectable in upper respiratory specimens  during the acute phase of infection.  Positive results are indicative  of the presence of the identified virus, but do not rule out bacterial infection or co-infection with other pathogens not detected by the test.  Clinical correlation with patient history and  other diagnostic information is necessary to determine patient infection status.  The expected result is negative.  Fact Sheet for Patients:   08/02/22   Fact Sheet for Healthcare Providers:   RoadLapTop.co.za    This test is not yet approved or cleared by the http://kim-miller.com/ FDA and  has been authorized for detection and/or diagnosis of SARS-CoV-2 by FDA under an Emergency Use Authorization (EUA).  This EUA will remain in effect (meaning this test can be used) for the duration of  the COVID-19 declaration under Section 564(b)(1)  of the Act, 21 U.S.C. section 360-bbb-3(b)(1), unless the authorization is terminated or revoked sooner.   Performed at [facility], [address] [place], [place]      Radiological Exams on Admission: CT Angio Chest PE W and/or Wo Contrast  Result Date: 06/03/2022 CLINICAL DATA:  Pulmonary embolism (PE) suspected, high prob. COVID pneumonia, generalized weakness. EXAM: CT ANGIOGRAPHY CHEST WITH CONTRAST TECHNIQUE: Multidetector CT imaging of the chest was performed using the  standard protocol during bolus administration of intravenous contrast. Multiplanar CT image reconstructions and MIPs were obtained to evaluate the vascular anatomy. RADIATION DOSE REDUCTION: This exam was performed according to the departmental dose-optimization program which includes automated exposure control, adjustment of the mA and/or kV according to patient size and/or use of iterative reconstruction technique. CONTRAST:  80mL OMNIPAQUE IOHEXOL 350 MG/ML SOLN  COMPARISON:  None Available. FINDINGS: Cardiovascular: Adequate opacification of the pulmonary arterial tree. No intraluminal filling defect identified to suggest acute pulmonary embolism. Central pulmonary arteries are of normal caliber. Mild coronary artery calcification. Cardiac size within normal limits. No pericardial effusion. Moderate atherosclerotic calcification within the thoracic aorta. No aortic aneurysm. Mediastinum/Nodes: No pathologic thoracic adenopathy. Esophagus is unremarkable. Lungs/Pleura: There is mild diffuse bronchiectasis with scattered areas of airway impaction within the lingula, medial left upper lobe and right upper lobe with tree-in-bud nodularity best appreciated within the right upper lobe. Together, the findings can be seen the setting of acute infection, chronic atypical infection including mycobacterial or atypical fungal infection, or aspiration. Mosaic attenuation throughout the pulmonary parenchyma is in keeping with multifocal air trapping likely related to small airways disease. Asymmetrically, there is extensive airway impaction within the dependent right lower lobe with associated bronchial wall thickening and widespread ground-glass opacity in this region in keeping with changes of aspiration or acute infection. No pneumothorax or pleural effusion.  No central obstructing mass. Upper Abdomen: Status post cholecystectomy.  No acute abnormality. Musculoskeletal: No acute bone abnormality. No lytic or blastic bone  lesion. Review of the MIP images confirms the above findings. IMPRESSION: 1. No pulmonary embolism. 2. Mild multifocal airway impaction and tree-in-bud nodularity in keeping with acute infection or chronic atypical infection such as mycobacterial or atypical fungal infection, or aspiration. 3. Asymmetrically extensive airway impaction within the dependent right lower lobe with associated bronchial wall thickening and widespread ground-glass opacity in keeping with aspiration or acute infection. 4. Multifocal air trapping likely related to small airways disease. 5. Mild coronary artery calcification. Aortic Atherosclerosis (ICD10-I70.0). Electronically Signed   By: Helyn Numbers M.D.   On: 06/03/2022 02:54   CT HEAD WO CONTRAST ( )  Result Date: 06/03/2022 CLINICAL DATA:  Mental status change, unknown cause Generalized weakness. EXAM: CT HEAD WITHOUT CONTRAST TECHNIQUE: Contiguous axial images were obtained from the base of the skull through the vertex without intravenous contrast. RADIATION DOSE REDUCTION: This exam was performed according to the departmental dose-optimization program which includes automated exposure control, adjustment of the mA and/or kV according to patient size and/or use of iterative reconstruction technique. COMPARISON:  None Available. FINDINGS: Brain: No intracranial hemorrhage, mass effect, or midline shift. Normal for age atrophy. No hydrocephalus. The basilar cisterns are patent. No evidence of territorial infarct or acute ischemia. No extra-axial or intracranial fluid collection. Vascular: Atherosclerosis of skullbase vasculature without hyperdense vessel or abnormal calcification. Skull: No fracture or focal lesion. Sinuses/Orbits: Small fluid levels in the sphenoid sinuses with frothy debris. Scattered mucosal thickening of the ethmoid air cells. Occasional opacification of lower bilateral mastoid air cells. Other: None. IMPRESSION: 1. No acute intracranial abnormality. 2. Mild  paranasal sinus inflammation with fluid levels in the sphenoid sinuses, may be secondary to acute sinusitis. Electronically Signed   By: Narda Rutherford M.D.   On: 06/03/2022 02:41   DG Chest 1 View  Result Date: 06/02/2022 CLINICAL DATA:  COVID, generalized weakness EXAM: CHEST  1 VIEW COMPARISON:  None Available. FINDINGS: Cardiac and mediastinal contours are within normal limits. No focal pulmonary opacity. No pleural effusion or pneumothorax. No acute osseous abnormality. IMPRESSION: No acute cardiopulmonary process. Electronically Signed   By: Wiliam Ke M.D.   On: 06/02/2022 20:03      Assessment/Plan Principal Problem:   Acute respiratory disease due to COVID-19 virus Active Problems:   Essential (primary) hypertension   Chronic kidney disease, stage 3b (HCC)   Moderate  dementia without behavioral disturbance, psychotic disturbance, mood disturbance, or anxiety (HCC)   Acute metabolic encephalopathy   UTI (urinary tract infection)   Sepsis (HCC)   SVT (supraventricular tachycardia) (HCC)   Myocardial injury   Protein-calorie malnutrition, severe (HCC)   Thrombocytopenia (HCC)   Assessment and Plan: * Acute respiratory disease due to COVID-19 virus Oxygen desaturation to 91% on room air, which improved to 98% on 2 L oxygen.  Chest x-ray negative, blood CTA showed atypical infiltration.  -Admit to telemetry bed as inpatient -Bronchodilators -As needed Mucinex -Started molnupiravir  Thrombocytopenia (HCC) Platelet 115.  Likely due to ongoing infection -Check LDH and peripheral smear  Protein-calorie malnutrition, severe (HCC) Body weight 46.7 kg, BMI 17.67 - Consult nutrition   Myocardial injury Myocardial infarction due to COVID infection, UTI and sepsis. Troponin level 21 -asa 81 mg daily -trend trop -check A1c and FLp  SVT (supraventricular tachycardia) (HCC) Heart rate improved from initial 196 --> 80s.  TSH normal 2.461.  This is likely triggered by  ongoing infection -Patient received 5 mg of Cardizem and 5 mg of metoprolol in ED -As needed metoprolol 2.5 mg every 3 hours   Sepsis (HCC) Patient meets criteria for sepsis with tachycardia and tachypnea with RR 27.  This is due to combination of UTI and COVID infection.  Lactic acid normal 1.3, 1.3 -Check procalcitonin level -IV fluid: 1 L LR, followed by 75 cc/h of normal saline  UTI (urinary tract infection) - IV Rocephin -Follow-up of urine culture  Acute metabolic encephalopathy Likely due to multifactorial etiology, including UTI and COVID-19 infection.  CT head negative.  Patient moves all extremities. -Frequent neuro check  Moderate dementia without behavioral disturbance, psychotic disturbance, mood disturbance, or anxiety (HCC) - Fall precaution  Chronic kidney disease, stage 3b (HCC) Renal function is close to baseline.  Recent baseline creatinine 1.2-1.5.  Her creatinine is 1.46, BUN 35 -Monitored renal function by BMP  Essential (primary) hypertension - IV hydralazine as needed -Hold spironolactone since patient has decreased oral intake          DVT ppx: SCD  Code Status: Full code per her son  Family Communication: Yes, patient's   son  by phone  Disposition Plan:  Anticipate discharge back to previous environment  Consults called:  none  Admission status and Level of care: Telemetry Cardiac:    as inpt      Severity of Illness:  The appropriate patient status for this patient is INPATIENT. Inpatient status is judged to be reasonable and necessary in order to provide the required intensity of service to ensure the patient's safety. The patient's presenting symptoms, physical exam findings, and initial radiographic and laboratory data in the context of their chronic comorbidities is felt to place them at high risk for further clinical deterioration. Furthermore, it is not anticipated that the patient will be medically stable for discharge from the  hospital within 2 midnights of admission.   * I certify that at the point of admission it is my clinical judgment that the patient will require inpatient hospital care spanning beyond 2 midnights from the point of admission due to high intensity of service, high risk for further deterioration and high frequency of surveillance required.*       Date of Service 06/03/2022    Lorretta Harp [facility]   If 7PM-7AM, please contact night-coverage [email or web address] 06/03/2022, 6:31 PM

## 2022-06-03 NOTE — Progress Notes (Signed)
Initial Nutrition Assessment  DOCUMENTATION CODES:   Underweight  INTERVENTION:   -Ensure Enlive po TID, each supplement provides 350 kcal and 20 grams of protein -MVI with minerals daily -Liberalize diet to regular for widest variety of meal selections  NUTRITION DIAGNOSIS:   Increased nutrient needs related to acute illness as evidenced by estimated needs.  GOAL:   Patient will meet greater than or equal to 90% of their needs  MONITOR:   PO intake, Supplement acceptance  REASON FOR ASSESSMENT:   Consult Assessment of nutrition requirement/status  ASSESSMENT:   Pt with a past medical history of HTN, vitamin D deficiency, osteoporosis and moderate dementia who presents for evaluation of worsening weakness cough and inability to ambulate  Pt admitted with COVID infection.   Reviewed I/O's: +1.1 L x 24 hours   Per ED notes, pt with poor appetite since Sunday.   Pt currently on a heart healthy diet. No meal completion data available to assess at this time.   Reviewed wt hx; wt has been stable over the past 2 months.   Due to underweight status, highly suspect malnutrition, however, unable to assess at this time. Pt would greatly benefit from addition of oral nutrition supplements.   Medications reviewed and include vitamin D3 and vitamin B12.   Labs reviewed.    Diet Order:   Diet Order             Diet Heart Room service appropriate? Yes; Fluid consistency: Thin  Diet effective now                   EDUCATION NEEDS:   No education needs have been identified at this time  Skin:  Skin Assessment: Reviewed RN Assessment  Last BM:  Unknown  Height:   Ht Readings from Last 1 Encounters:  06/03/22 5\' 4"  (1.626 m)    Weight:   Wt Readings from Last 1 Encounters:  06/03/22 46.7 kg    Ideal Body Weight:  54.5 kg  BMI:  Body mass index is 17.67 kg/m.  Estimated Nutritional Needs:   Kcal:  1650-1850  Protein:  80-95 grams  Fluid:  > 1.6  L    08/03/22, RD, LDN, CDCES Registered Dietitian II Certified Diabetes Care and Education Specialist Please refer to [facility] for RD and/or RD on-call/weekend/after hours pager

## 2022-06-03 NOTE — ED Notes (Signed)
MD notified that pt HR remains high after cardizem and fluids. Also aware of failed swallow screen

## 2022-06-03 NOTE — Assessment & Plan Note (Signed)
Urine cultures with Klebsiella pneumonia and E. coli-pending susceptibility. - IV Rocephin -Follow-up of urine culture

## 2022-06-03 NOTE — Assessment & Plan Note (Signed)
Patient was weaned off to room air.  No obvious respiratory distress. Oriented to name only and refusing a lot of care. CTA with bilateral infiltrate concerning for atypical/viral pneumonia.  Procalcitonin negative. -Started molnupiravir-day 2 -Continue with supportive care -Check CRP-if elevated we can start her on steroid.

## 2022-06-03 NOTE — Assessment & Plan Note (Signed)
Likely due to multifactorial etiology, including UTI and COVID-19 infection.  CT head negative.  Patient moves all extremities. -Frequent neuro check

## 2022-06-03 NOTE — Assessment & Plan Note (Signed)
Body weight 46.7 kg, BMI 17.67 - Consult nutrition

## 2022-06-03 NOTE — Assessment & Plan Note (Signed)
-   Fall precaution -Delirium precautions

## 2022-06-03 NOTE — ED Notes (Signed)
Pt granddaughter at bedside and update given.

## 2022-06-03 NOTE — Assessment & Plan Note (Addendum)
Unable to tell any chest pain or shortness of breath.  Troponin positive with a flat curve, most likely secondary to demand ischemia with current illness. A1c of 5.3 and lipid panel normal. -asa 81 mg daily

## 2022-06-03 NOTE — Assessment & Plan Note (Signed)
Renal function is close to baseline.  Recent baseline creatinine 1.2-1.5.  Her creatinine is 1.46, BUN 35 -Monitored renal function by BMP -Avoid nephrotoxins

## 2022-06-03 NOTE — ED Notes (Signed)
Informed RN bed assigned 

## 2022-06-03 NOTE — Progress Notes (Signed)
    Palliative Medicine Team    Palliative Medicine consult noted. Due to high referral volume, there may be a delay seeing this patient. Please call the Palliative Medicine Team office at [phone] if recommendations are needed in the interim. A provider will be available on site 06/06/22. If patient is expected to discharge before this date, please make outpatient palliative care referral as appropriate.        Thank you for inviting us to participate in the care of this patient.   Amiliana Foutz, MSN, RN Palliative Medicine Team  

## 2022-06-03 NOTE — Assessment & Plan Note (Signed)
Blood pressure within goal. - IV hydralazine as needed -Hold spironolactone since patient has decreased oral intake

## 2022-06-03 NOTE — Assessment & Plan Note (Addendum)
Platelet 115>>103.  Likely due to ongoing infection.  LDH normal -Continue to monitor

## 2022-06-03 NOTE — ED Notes (Signed)
Patient transported to CT 

## 2022-06-03 NOTE — ED Notes (Addendum)
Pt presents to ED with c/o weakness. Pt currently have runs of SVT on the monitor and then pt's HR will go into NSR. Antoine Primas, MD made aware. Pt seems more lethargic. Pt has a hx of dementia and not answering questions at this time. No signs of distress noted.

## 2022-06-03 NOTE — ED Notes (Addendum)
Pt removed all clothing and purewick. Also removed both IV accesses in L arm. In process of trying to remove R  AC IV. Recovered with coban. Replaced brief and new purewick. Now wearing gown and covers with bed alarm on. Safety mitts applied

## 2022-06-04 DIAGNOSIS — J069 Acute upper respiratory infection, unspecified: Secondary | ICD-10-CM | POA: Diagnosis not present

## 2022-06-04 DIAGNOSIS — U071 COVID-19: Secondary | ICD-10-CM | POA: Diagnosis not present

## 2022-06-04 LAB — CBC WITH DIFFERENTIAL/PLATELET
Abs Immature Granulocytes: 0.03 10*3/uL (ref 0.00–0.07)
Basophils Absolute: 0 10*3/uL (ref 0.0–0.1)
Basophils Relative: 0 %
Eosinophils Absolute: 0 10*3/uL (ref 0.0–0.5)
Eosinophils Relative: 0 %
HCT: 35.9 % — ABNORMAL LOW (ref 36.0–46.0)
Hemoglobin: 11.6 g/dL — ABNORMAL LOW (ref 12.0–15.0)
Immature Granulocytes: 0 %
Lymphocytes Relative: 8 %
Lymphs Abs: 1.1 10*3/uL (ref 0.7–4.0)
MCH: 30.1 pg (ref 26.0–34.0)
MCHC: 32.3 g/dL (ref 30.0–36.0)
MCV: 93 fL (ref 80.0–100.0)
Monocytes Absolute: 0.6 10*3/uL (ref 0.1–1.0)
Monocytes Relative: 5 %
Neutro Abs: 10.9 10*3/uL — ABNORMAL HIGH (ref 1.7–7.7)
Neutrophils Relative %: 87 %
Platelets: 103 10*3/uL — ABNORMAL LOW (ref 150–400)
RBC: 3.86 MIL/uL — ABNORMAL LOW (ref 3.87–5.11)
RDW: 13 % (ref 11.5–15.5)
WBC: 12.6 10*3/uL — ABNORMAL HIGH (ref 4.0–10.5)
nRBC: 0 % (ref 0.0–0.2)

## 2022-06-04 LAB — COMPREHENSIVE METABOLIC PANEL
ALT: 16 U/L (ref 0–44)
AST: 40 U/L (ref 15–41)
Albumin: 2.9 g/dL — ABNORMAL LOW (ref 3.5–5.0)
Alkaline Phosphatase: 31 U/L — ABNORMAL LOW (ref 38–126)
Anion gap: 11 (ref 5–15)
BUN: 33 mg/dL — ABNORMAL HIGH (ref 8–23)
CO2: 21 mmol/L — ABNORMAL LOW (ref 22–32)
Calcium: 7.9 mg/dL — ABNORMAL LOW (ref 8.9–10.3)
Chloride: 110 mmol/L (ref 98–111)
Creatinine, Ser: 1.19 mg/dL — ABNORMAL HIGH (ref 0.44–1.00)
GFR, Estimated: 44 mL/min — ABNORMAL LOW (ref >60)
Glucose, Bld: 100 mg/dL — ABNORMAL HIGH (ref 70–99)
Potassium: 3.4 mmol/L — ABNORMAL LOW (ref 3.5–5.1)
Sodium: 142 mmol/L (ref 135–145)
Total Bilirubin: 1.2 mg/dL (ref 0.3–1.2)
Total Protein: 6.4 g/dL — ABNORMAL LOW (ref 6.5–8.1)

## 2022-06-04 LAB — GLUCOSE, CAPILLARY
Glucose-Capillary: 101 mg/dL — ABNORMAL HIGH (ref 70–99)
Glucose-Capillary: 107 mg/dL — ABNORMAL HIGH (ref 70–99)
Glucose-Capillary: 109 mg/dL — ABNORMAL HIGH (ref 70–99)
Glucose-Capillary: 78 mg/dL (ref 70–99)
Glucose-Capillary: 86 mg/dL (ref 70–99)
Glucose-Capillary: 96 mg/dL (ref 70–99)

## 2022-06-04 LAB — HEMOGLOBIN A1C
Hgb A1c MFr Bld: 5.3 % (ref 4.8–5.6)
Mean Plasma Glucose: 105.41 mg/dL

## 2022-06-04 MED ORDER — ENOXAPARIN SODIUM 30 MG/0.3ML IJ SOSY
30.0000 mg | PREFILLED_SYRINGE | INTRAMUSCULAR | Status: DC
  Administered 2022-06-04: 30 mg via SUBCUTANEOUS
  Filled 2022-06-04: qty 0.3

## 2022-06-04 NOTE — Progress Notes (Signed)
Progress Note   Patient: Gloria Mosley INO:[id number] DOB: 1933-03-08 DOA: 06/03/2022     1 DOS: the patient was seen and examined on 06/04/2022   Brief hospital course: Taken from H&P.  Gloria Mosley is a 88 y.o. female with medical history significant of hypertension, hyperlipidemia, CKD-3B, dementia, osteoporosis, who presents with altered mental status and cough.   Patient has AMS,  and is unable to provide accurate medical history.  I called her son by phone, who provided medical history.  Per his son, at her normal baseline, patient is orientated x 3, interacting with family members.  In the past several days, patient has been confused, less reactive, has poor appetite and decreased oral intake.  Family members has COVID infection.  Patient also developed cough and mild shortness of breath, with little sputum production.  Did not complain of chest pain.  No fever or chills.  Patient also has increased urinary frequency, not sure if patient has dysuria or burning on urination.  No active nausea vomiting or diarrhea noted.  Did not complain abdominal pain for her son.  Patient moves all extremities.   Patient was found to have SVT with heart rate up to 196, which improved to 140s then to 80s after treated with IV 5 mg of Cardizem and 5 mg of metoprolol in ED.  Data reviewed independently and ED Course: pt was found to have positive COVID PCR, positive urinalysis (cloudy appearance, large amount of leukocyte, many bacteria, WBC 21-25), lactic acid 1.3 --> 1.3, INR 1.0, PTT 34, troponin level 21, renal function close to baseline, WBC 5.9, platelet 115, VBG with pH 7.35, CO2 53, O2 <31.  Temperature normal, blood pressure 116/82, RR 27, oxygen saturation 91%, which improved to 98% on 2 L oxygen.  Chest x-ray negative.  CT head negative.     CTA: 1. No pulmonary embolism. 2. Mild multifocal airway impaction and tree-in-bud nodularity in keeping with acute infection or chronic atypical infection such  as mycobacterial or atypical fungal infection, or aspiration. 3. Asymmetrically extensive airway impaction within the dependent right lower lobe with associated bronchial wall thickening and widespread ground-glass opacity in keeping with aspiration or acute infection. 4. Multifocal air trapping likely related to small airways disease. 5. Mild coronary artery calcification.   Aortic Atherosclerosis (ICD10-I70.0).   EKG: I have personally reviewed.  SVT, QTc 506, heart rate 196, nonspecific T wave change.  8/12: Hemodynamically stable, saturating 100% on room air.  Procalcitonin negative. She was started on ceftriaxone for concern of UTI, urine cultures with Klebsiella pneumonia and E. coli, pending susceptibility. She was started on molnupiravir for COVID-19 pneumonia Discussed with daughter and at baseline patient is oriented to self only, she was able to walk around somewhat although very slow few days ago, daughter helps her with ADLs and he lives with her son.  At times she does not recognizes her daughter but she do recognize her son.  Patient has advanced dementia at baseline and will be more prone to get delirium while in the hospital. We discussed again her CODE STATUS and daughter will discuss with her brother but would like to keep her as full code despite understanding that she is very fragile and performing CPR will harm her more than giving any benefit. We will keep her full code at this time-negative care was also consulted.     Assessment and Plan: * Acute respiratory disease due to COVID-19 virus Patient was weaned off to room air.  No obvious respiratory  distress. Oriented to name only and refusing a lot of care. CTA with bilateral infiltrate concerning for atypical/viral pneumonia.  Procalcitonin negative. -Started molnupiravir-day 2 -Continue with supportive care -Check CRP-if elevated we can start her on steroid.  Acute metabolic encephalopathy Likely due to  multifactorial etiology, including UTI and COVID-19 infection.  CT head negative.  Patient moves all extremities and oriented to name only. -Continue to monitor  UTI (urinary tract infection) Urine cultures with Klebsiella pneumonia and E. coli-pending susceptibility. - IV Rocephin -Follow-up of urine culture  Sepsis [facility]) Patient meets criteria for sepsis with tachycardia and tachypnea with RR 27.  This is due to combination of UTI and COVID infection.  Lactic acid normal 1.3, 1.3.  Procalcitonin negative. Received fluid and started on ceftriaxone for concern of UTI.   Moderate dementia without behavioral disturbance, psychotic disturbance, mood disturbance, or anxiety (HCC) - Fall precaution -Delirium precautions  SVT (supraventricular tachycardia) (HCC) Resolved. Heart rate improved from initial 196 --> 80s.  TSH normal 2.461.  This is likely triggered by ongoing infection -Patient received 5 mg of Cardizem and 5 mg of metoprolol in ED -As needed metoprolol 2.5 mg every 3 hours   Chronic kidney disease, stage 3b (HCC) Renal function is close to baseline.  Recent baseline creatinine 1.2-1.5.  Her creatinine is 1.46, BUN 35 -Monitored renal function by BMP -Avoid nephrotoxins  Essential (primary) hypertension Blood pressure within goal. - IV hydralazine as needed -Hold spironolactone since patient has decreased oral intake  Protein-calorie malnutrition, severe (HCC) Body weight 46.7 kg, BMI 17.67 - Consult nutrition   Myocardial injury Unable to tell any chest pain or shortness of breath.  Troponin positive with a flat curve, most likely secondary to demand ischemia with current illness. A1c of 5.3 and lipid panel normal. -asa 81 mg daily   Thrombocytopenia (HCC) Platelet 115>>103.  Likely due to ongoing infection.  LDH normal -Continue to monitor   Subjective: Patient was seen and examined today.  She was oriented to name only.  Unable to explain any pain but  appears comfortable with no shortness of breath.  Physical Exam: Vitals:   06/04/22 0329 06/04/22 0800 06/04/22 1042 06/04/22 1152  BP: (!) 115/50 (!) 112/55  (!) 115/55  Pulse: 79 70  77  Resp:  16    Temp:  97.6 F (36.4 C)  98.5 F (36.9 C)  TempSrc:  Axillary  Axillary  SpO2: 99% 100% 97% 97%  Weight:      Height:       General.  Frail and severely malnourished elderly lady, in no acute distress. Pulmonary.  Lungs clear bilaterally, normal respiratory effort. CV.  Regular rate and rhythm, no JVD, rub or murmur. Abdomen.  Soft, nontender, nondistended, BS positive. CNS.  Alert and oriented to name only.  No focal neurologic deficit. Extremities.  No edema, no cyanosis, pulses intact and symmetrical. Psychiatry.  Judgment and insight appears impaired.  Data Reviewed: Prior data which include notes, labs and images reviewed  Family Communication: Discussed with daughter on phone  Disposition: Status is: Inpatient Remains inpatient appropriate because: Severity of illness   Planned Discharge Destination: To be determined-pending PT/OT evaluation  Time spent: 50 minutes  This record has been created using Conservation officer, historic buildings. Errors have been sought and corrected,but may not always be located. Such creation errors do not reflect on the standard of care.  Author: Arnetha Courser, MD 06/04/2022 1:47 PM  For on call review [email or web address].

## 2022-06-04 NOTE — Progress Notes (Signed)
Patient's granddaughter was at bedside and prior to leaving she stated that she spoke to patient's daughter and the rest of the family and they all agree to DNR status. MD notified.

## 2022-06-04 NOTE — Hospital Course (Addendum)
Taken from H&P.  Gloria Mosley is a 88 y.o. female with medical history significant of hypertension, hyperlipidemia, CKD-3B, dementia, osteoporosis, who presents with altered mental status and cough.   Patient has AMS,  and is unable to provide accurate medical history.  I called her son by phone, who provided medical history.  Per his son, at her normal baseline, patient is orientated x 3, interacting with family members.  In the past several days, patient has been confused, less reactive, has poor appetite and decreased oral intake.  Family members has COVID infection.  Patient also developed cough and mild shortness of breath, with little sputum production.  Did not complain of chest pain.  No fever or chills.  Patient also has increased urinary frequency, not sure if patient has dysuria or burning on urination.  No active nausea vomiting or diarrhea noted.  Did not complain abdominal pain for her son.  Patient moves all extremities.   Patient was found to have SVT with heart rate up to 196, which improved to 140s then to 80s after treated with IV 5 mg of Cardizem and 5 mg of metoprolol in ED.  Data reviewed independently and ED Course: pt was found to have positive COVID PCR, positive urinalysis (cloudy appearance, large amount of leukocyte, many bacteria, WBC 21-25), lactic acid 1.3 --> 1.3, INR 1.0, PTT 34, troponin level 21, renal function close to baseline, WBC 5.9, platelet 115, VBG with pH 7.35, CO2 53, O2 <31.  Temperature normal, blood pressure 116/82, RR 27, oxygen saturation 91%, which improved to 98% on 2 L oxygen.  Chest x-ray negative.  CT head negative.     CTA: 1. No pulmonary embolism. 2. Mild multifocal airway impaction and tree-in-bud nodularity in keeping with acute infection or chronic atypical infection such as mycobacterial or atypical fungal infection, or aspiration. 3. Asymmetrically extensive airway impaction within the dependent right lower lobe with associated bronchial  wall thickening and widespread ground-glass opacity in keeping with aspiration or acute infection. 4. Multifocal air trapping likely related to small airways disease. 5. Mild coronary artery calcification.   Aortic Atherosclerosis (ICD10-I70.0).   EKG: I have personally reviewed.  SVT, QTc 506, heart rate 196, nonspecific T wave change.  8/12: Hemodynamically stable, saturating 100% on room air.  Procalcitonin negative. She was started on ceftriaxone for concern of UTI, urine cultures with Klebsiella pneumonia and E. coli, pending susceptibility. She was started on molnupiravir for COVID-19 pneumonia Discussed with daughter and at baseline patient is oriented to self only, she was able to walk around somewhat although very slow few days ago, daughter helps her with ADLs and he lives with her son.  At times she does not recognizes her daughter but she do recognize her son.  Patient has advanced dementia at baseline and will be more prone to get delirium while in the hospital. We discussed again her CODE STATUS and daughter will discuss with her brother but would like to keep her as full code despite understanding that she is very fragile and performing CPR will harm her more than giving any benefit. We will keep her full code at this time-palliative care was also consulted.  8/13: CRP was elevated at 10.5, she was started on prednisone.  Remained on room air. PT and OT are recommending home health services.  Patient otherwise stable and at her baseline.  She wants to go home and is being discharged home to complete the course of molnupiravir and also given cefdinir for concern of  UTI, susceptibility still pending.  Patient will continue on current medication and will follow-up with her primary care provider for further recommendations.

## 2022-06-04 NOTE — Evaluation (Signed)
Clinical/Bedside Swallow Evaluation Patient Details  Name: Gloria Mosley MRN: [id number] Date of Birth: [birth year]  Today's Date: 06/04/2022 Time: SLP Start Time (ACUTE ONLY): 0845 SLP Stop Time (ACUTE ONLY): 0920 SLP Time Calculation (min) (ACUTE ONLY): 35 min  Past Medical History:  Past Medical History:  Diagnosis Date   Anxiety    Arthritis    Hyperlipidemia    Hypertension    Osteoporosis    Past Surgical History:  Past Surgical History:  Procedure Laterality Date   CHOLECYSTECTOMY  1999   Surgeon: Dr. Michela Pitcher   Repair Dupuytren's Contracture  2011   Dr. Katrinka Blazing   HPI:  Patient is an 88 y.o. female with past medical history including dementia, HTN, HLD, CKD, osteoporosis who presented with altered mental status and cough, and was found to be COVID positive, admitted for acute respiratory distress, UTI, and sepsis. Per chart review, MBS was ordered in 2021 per neurology but never completed. CT Angio Chest 06/03/22: "1. No pulmonary embolism.  2. Mild multifocal airway impaction and tree-in-bud nodularity in  keeping with acute infection or chronic atypical infection such as  mycobacterial or atypical fungal infection, or aspiration.  3. Asymmetrically extensive airway impaction within the dependent  right lower lobe with associated bronchial wall thickening and  widespread ground-glass opacity in keeping with aspiration or acute  infection.  4. Multifocal air trapping likely related to small airways disease.  5. Mild coronary artery calcification"    Assessment / Plan / Recommendation  Clinical Impression   Patient presents with signs and symptoms of multifactorial oropharyngeal dysphagia and overall moderate risk for aspiration given cognitive deficits, deconditioning, and respiratory status. Patient alert and following some simple commands. She requested water but otherwise verbal responses were minimally intelligible due to mumbling and low vocal intensity. With removal of hand mitts, pt  was able to hold cup with assistance but required usual mod-max assistance to participate in feeding. When ice chip placed on pt's tongue, she did not make any efforts to manipulate despite cues. When participating in feeding (holding cup with assistance to bring to lips), pt benefitted from tactile/sensory feedback with more automatic swallowing response. Multiple swallows noted for each bite/sip of each consistency tested (thin, nectar, puree), suggestive of possible pharyngeal weakness/residue. With thin liquids, pt had intermittent overt signs of aspiration including immediate cough x1, wet vocal quality, typically following apparent oral discoordination and/or oral holding. Pt appeared to have more timely swallow initiation and improved oral control with nectar thick liquids, as well as no overt s/sx of aspiration. With puree, pt required extended time and multiple swallows to clear oral residue, however she initiates these with minimal cuing. Recommend initiating dysphagia 1 (puree) and nectar thick liquids (by cup or spoon, as tolerated), and meds crushed in puree. Pt will require full assistance for feeding, however should be encouraged and supported to participate as much as possible in feeding as she benefits from sensory feedback to reduce oral holding and improve timeliness of swallow. Position fully upright and feed only when alert. SLP to follow for tolerance and possible advancement of liquids vs instrumental testing (MBS), depending on improvements at bedside and pt/family goals of care.    SLP Visit Diagnosis: Dysphagia, oropharyngeal phase (R13.12)    Aspiration Risk  Moderate aspiration risk    Diet Recommendation Dysphagia 1 (Puree);Nectar-thick liquid   Liquid Administration via: Cup;Spoon Medication Administration: Crushed with puree Supervision: Full supervision/cueing for compensatory strategies;Staff to assist with self feeding Compensations: Slow rate;Small sips/bites;Lingual  sweep for  clearance of pocketing Postural Changes: Seated upright at 90 degrees;Remain upright for at least 30 minutes after po intake    Other  Recommendations Oral Care Recommendations: Oral care before and after PO Other Recommendations: Order thickener from pharmacy    Recommendations for follow up therapy are one component of a multi-disciplinary discharge planning process, led by the attending physician.  Recommendations may be updated based on patient status, additional functional criteria and insurance authorization.  Follow up Recommendations Skilled nursing-short term rehab (<3 hours/day)      Assistance Recommended at Discharge Frequent or constant Supervision/Assistance  Functional Status Assessment Patient has had a recent decline in their functional status and/or demonstrates limited ability to make significant improvements in function in a reasonable and predictable amount of time  Frequency and Duration min 2x/week  2 weeks       Prognosis Prognosis for Safe Diet Advancement: Fair Barriers to Reach Goals: Cognitive deficits      Swallow Study   General Date of Onset: 06/03/22 HPI: Patient is an 88 y.o. female with past medical history including dementia, HTN, HLD, CKD, osteoporosis who presented with altered mental status and cough, and was found to be COVID positive, admitted for acute respiratory distress, UTI, and sepsis. Per chart review, MBS was ordered in 2021 per neurology but never completed. CT Angio Chest 06/03/22: "1. No pulmonary embolism.  2. Mild multifocal airway impaction and tree-in-bud nodularity in  keeping with acute infection or chronic atypical infection such as  mycobacterial or atypical fungal infection, or aspiration.  3. Asymmetrically extensive airway impaction within the dependent  right lower lobe with associated bronchial wall thickening and  widespread ground-glass opacity in keeping with aspiration or acute  infection.  4. Multifocal air  trapping likely related to small airways disease.  5. Mild coronary artery calcification" Type of Study: Bedside Swallow Evaluation Previous Swallow Assessment: none on file Diet Prior to this Study: NPO Temperature Spikes Noted: No Respiratory Status: Nasal cannula History of Recent Intubation: No Behavior/Cognition: Alert;Confused;Distractible;Requires cueing Oral Cavity Assessment: Within Functional Limits Oral Care Completed by SLP: Yes Self-Feeding Abilities: Needs assist Patient Positioning: Upright in bed Baseline Vocal Quality: Low vocal intensity Volitional Cough: Cognitively unable to elicit Volitional Swallow: Unable to elicit    Oral/Motor/Sensory Function Overall Oral Motor/Sensory Function: Generalized oral weakness Facial Symmetry: Abnormal symmetry left   Ice Chips Ice chips: Impaired Presentation: Spoon Oral Phase Impairments: Reduced labial seal;Poor awareness of bolus Oral Phase Functional Implications: Prolonged oral transit;Oral holding   Thin Liquid Thin Liquid: Impaired Presentation: Cup;Straw Oral Phase Impairments: Reduced lingual movement/coordination;Poor awareness of bolus Oral Phase Functional Implications: Left anterior spillage;Prolonged oral transit;Oral holding Pharyngeal  Phase Impairments: Suspected delayed Swallow;Multiple swallows;Wet Vocal Quality;Cough - Immediate Other Comments: overt s/sx aspiration after prolonged oral holding only    Nectar Thick Nectar Thick Liquid: Impaired Presentation: Cup;Spoon Oral phase functional implications: Other (comment) (prolonged but functional oral transit) Pharyngeal Phase Impairments: Multiple swallows   Honey Thick Honey Thick Liquid: Not tested   Puree Puree: Impaired Presentation: Spoon Oral Phase Impairments: Reduced lingual movement/coordination Oral Phase Functional Implications: Prolonged oral transit Pharyngeal Phase Impairments: Multiple swallows   Solid     Solid: Not tested     Rondel Baton, MS, CCC-SLP Speech-Language Pathologist Office: [phone] ASCOM: [phone]   Arlana Lindau 06/04/2022,9:31 AM

## 2022-06-04 NOTE — Plan of Care (Signed)

## 2022-06-04 NOTE — Evaluation (Signed)
Occupational Therapy Evaluation Patient Details Name: Gloria Mosley MRN: [id number] DOB: May 18, 1933 Today's Date: 06/04/2022   History of Present Illness presented to ER secondary to AMS, cough; admitted for management of acute respiratory disease due to COVID-19 infection, sepsis due to UTI.   Clinical Impression   Pt was seen for OT evaluation this date. Prior to hospital admission, pt was living with her son and requiring significant assist recently for all mobility. Daughter assists with ADL. Pt alert and pleasant, oriented to self. Able to follow simple commands with multimodal cues. Pt presents to acute OT demonstrating impaired ADL performance and functional mobility 2/2 decreased strength, activity tolerance, balance, and baseline cognitive deficits (See OT problem list). Pt currently requires MIN-MOD A +2 for ADL transfers with RW and for 4 lateral steps EOB. Initially requiring CGA-MIN A for static sitting balance, improving to SBA for dyn balance as she readjusted her socks while seated EOB with cues for sequencing. Pt would benefit from skilled OT services to address noted impairments and functional limitations (see below for any additional details) in order to maximize safety and independence while minimizing falls risk and caregiver burden. Of note, patient resting on RA upon arrival to room (had pulled O2 off of self); sats >93%, left on RA end of session.  RN informed/aware and in agreement. Upon hospital discharge, recommend HHOT and 24/7 physical assist to maximize pt safety and return to functional independence during meaningful occupations of daily life. Family in room in agreement.    ------------------------------------------------------ Equipment needs:  RW, [facility], hospital bed, manual WC and cushion   Patient is not able to walk the distance required to go the bathroom, or he/she is unable to safely negotiate stairs required to access the bathroom.  A 3in1 BSC will alleviate this  problem     Patient suffers from acute respiratory failure (COVID-19) and sepsis which impairs his/her ability to perform daily activities like toileting, feeding, dressing, grooming, bathing in the home. A cane, walker, crutch will not resolve the patient's issue with performing activities of daily living. A lightweight wheelchair and cushion is required/recommended and will allow patient to safely perform daily activities.   Patient can safely propel the wheelchair in the home or has a caregiver who can provide assistance.         Recommendations for follow up therapy are one component of a multi-disciplinary discharge planning process, led by the attending physician.  Recommendations may be updated based on patient status, additional functional criteria and insurance authorization.   Follow Up Recommendations  Home health OT    Assistance Recommended at Discharge Frequent or constant Supervision/Assistance  Patient can return home with the following A lot of help with walking and/or transfers;A lot of help with bathing/dressing/bathroom;Assist for transportation;Assistance with cooking/housework;Direct supervision/assist for medications management;Help with stairs or ramp for entrance    Functional Status Assessment  Patient has had a recent decline in their functional status and demonstrates the ability to make significant improvements in function in a reasonable and predictable amount of time.  Equipment Recommendations  BSC/3in1;Other (comment) (2WW)    Recommendations for Other Services       Precautions / Restrictions Precautions Precautions: Fall Precaution Comments: Dys 1/nectar Restrictions Weight Bearing Restrictions: No      Mobility Bed Mobility Overal bed mobility: Needs Assistance Bed Mobility: Supine to Sit, Sit to Supine     Supine to sit: Mod assist Sit to supine: Min assist        Transfers Overall  transfer level: Needs assistance Equipment used:  Rolling walker (2 wheels) Transfers: Sit to/from Stand Sit to Stand: +2 physical assistance, Mod assist           General transfer comment: assist for lift off, anterior weight translation and overall balance; limited spontaneous righting reactions      Balance Overall balance assessment: Needs assistance Sitting-balance support: No upper extremity supported, Feet supported Sitting balance-Leahy Scale: Poor Sitting balance - Comments: initially, requiring min/mod assist for static sitting balance; does improve to close sup with weight shifting and dynamic reaching activities   Standing balance support: Bilateral upper extremity supported, Reliant on assistive device for balance Standing balance-Leahy Scale: Poor                             ADL either performed or assessed with clinical judgement   ADL                                         General ADL Comments: Pt requires MAX A for pericare in sidelying after incontinent bowel, able to adjust socks seated EOB with fair dyn bal after initially requiring assist for static balance, but anticipate need for MOD A for LB dressing otherwise. MAD A +2 for ADL transfers.     Vision         Perception     Praxis      Pertinent Vitals/Pain Pain Assessment Pain Assessment: Faces Faces Pain Scale: No hurt     Hand Dominance     Extremity/Trunk Assessment Upper Extremity Assessment Upper Extremity Assessment: Generalized weakness   Lower Extremity Assessment Lower Extremity Assessment: Generalized weakness       Communication Communication Communication: No difficulties   Cognition Arousal/Alertness: Awake/alert Behavior During Therapy: Anxious Overall Cognitive Status: Difficult to assess                                 General Comments: Per chart, at baseline, oriented only to self; inconsistently recognizes family members.  Currently oriented to self; does follow simple  commands (with some hand-over-hand and gestures); did recognize granddaughter that came into room during session     General Comments       Exercises     Shoulder Instructions      Home Living Family/patient expects to be discharged to:: Private residence Living Arrangements: Children Available Help at Discharge: Family;Available 24 hours/day Type of Home: House Home Access: Stairs to enter [employer] of Steps: 2   Home Layout: One level               Home Equipment: None          Prior Functioning/Environment Prior Level of Function : Needs assist             Mobility Comments: Sup/min assist for household mobility without assist device; progressive decline immediately prior to admission (to the point son was carrying to/from places) ADLs Comments: Daughter assists with ADLs        OT Problem List: Decreased strength;Decreased cognition;Decreased safety awareness;Decreased activity tolerance;Impaired balance (sitting and/or standing);Decreased knowledge of use of DME or AE      OT Treatment/Interventions: Self-care/ADL training;Therapeutic exercise;Therapeutic activities;DME and/or AE instruction;Cognitive remediation/compensation;Patient/family education;Balance training    OT Goals(Current goals can be found in  the care plan section) Acute Rehab OT Goals Patient Stated Goal: go home OT Goal Formulation: With patient/family Time For Goal Achievement: 06/18/22 Potential to Achieve Goals: Good ADL Goals Pt Will Perform Lower Body Dressing: with caregiver independent in assisting;sit to/from stand;with min assist Pt Will Transfer to Toilet: with min assist;stand pivot transfer;bedside commode (LRAD) Pt Will Perform Toileting - Clothing Manipulation and hygiene: with set-up;with supervision;sitting/lateral leans Additional ADL Goal #1: Pt will complete bed mobility with PRN MIN A from caregivers in anticipation of EOB ADL/mobility.  OT Frequency:  Min 2X/week    Co-evaluation PT/OT/SLP Co-Evaluation/Treatment: Yes Reason for Co-Treatment: For patient/therapist safety;To address functional/ADL transfers PT goals addressed during session: Mobility/safety with mobility;Balance;Proper use of DME OT goals addressed during session: Proper use of Adaptive equipment and DME;ADL's and self-care      AM-PAC OT "6 Clicks" Daily Activity     Outcome Measure Help from another person eating meals?: None Help from another person taking care of personal grooming?: A Little Help from another person toileting, which includes using toliet, bedpan, or urinal?: A Lot Help from another person bathing (including washing, rinsing, drying)?: A Lot Help from another person to put on and taking off regular upper body clothing?: A Little Help from another person to put on and taking off regular lower body clothing?: A Lot 6 Click Score: 16   End of Session Equipment Utilized During Treatment: Rolling walker (2 wheels) Nurse Communication: Mobility status  Activity Tolerance: Patient tolerated treatment well Patient left: in bed;with call bell/phone within reach;with bed alarm set;with family/visitor present  OT Visit Diagnosis: Other abnormalities of gait and mobility (R26.89);Muscle weakness (generalized) (M62.81);Other symptoms and signs involving cognitive function                Time: 1500-1527 OT Time Calculation (min): 27 min Charges:  OT General Charges $OT Visit: 1 Visit OT Evaluation $OT Eval Moderate Complexity: 1 Mod  Arman Filter., MPH, MS, OTR/L ascom [phone] 06/04/22, 4:15 PM

## 2022-06-04 NOTE — Evaluation (Signed)
Physical Therapy Evaluation Patient Details Name: Gloria Mosley MRN: [id number] DOB: 01/22/1933 Today's Date: 06/04/2022  History of Present Illness  presented to ER secondary to AMS, cough; admitted for management of acute respiratory disease due to COVID-19 infection, sepsis due to UTI.  Clinical Impression  Patient resting in bed upon arrival to room; alert and oriented to self only.  Follows simple commands, does require some degree of hand-over-hand and gesturing to fully comprehend and initiate tasks at times.  Speech initially slightly garbled and unintelligible, but improves markedly with increased alertness/attention to task and transition to upright.  Globally weak and deconditioned throughout all extremities; no focal weakness appreciated.  Able to complete bed mobility with mod assist; sit/stand, basic transfers and gait (4 steps laterally edge of bed) with RW, min/mod assist +2.  Demonstrates Generally poor balance (heavy posterior bias that worsens with fatigue and divided attention); unsafe to attempt without RW and +1-2 assist at all times. Of note, patient resting on RA upon arrival to room (had pulled O2 off of self); sats >93%, left on RA end of session.  RN informed/aware and in agreement. Would benefit from skilled PT to address above deficits and promote optimal return to PLOF.; Recommend transition to HHPT with 24/7 physical assist upon discharge from acute hospitalization. Granddaughter in room during session; voices this is in alignment with family preference and they are comfortable/willing to provide at discharge.  ---------------------- Equipment needs:  RW, [facility], hospital bed, manual WC and cushion  Patient is not able to walk the distance required to go the bathroom, or he/she is unable to safely negotiate stairs required to access the bathroom.  A 3in1 BSC will alleviate this problem   Patient suffers from acute respiratory failure (COVID-19) and sepsis which impairs  his/her ability to perform daily activities like toileting, feeding, dressing, grooming, bathing in the home. A cane, walker, crutch will not resolve the patient's issue with performing activities of daily living. A lightweight wheelchair and cushion is required/recommended and will allow patient to safely perform daily activities.   Patient can safely propel the wheelchair in the home or has a caregiver who can provide assistance.         Recommendations for follow up therapy are one component of a multi-disciplinary discharge planning process, led by the attending physician.  Recommendations may be updated based on patient status, additional functional criteria and insurance authorization.  Follow Up Recommendations Home health PT      Assistance Recommended at Discharge Frequent or constant Supervision/Assistance  Patient can return home with the following  A lot of help with walking and/or transfers;A lot of help with bathing/dressing/bathroom    Equipment Recommendations Rolling walker (2 wheels);BSC/3in1;Wheelchair (measurements PT);Wheelchair cushion (measurements PT);Hospital bed  Recommendations for Other Services       Functional Status Assessment Patient has had a recent decline in their functional status and demonstrates the ability to make significant improvements in function in a reasonable and predictable amount of time.     Precautions / Restrictions Precautions Precautions: Fall Precaution Comments: Dys 1/nectar Restrictions Weight Bearing Restrictions: No      Mobility  Bed Mobility Overal bed mobility: Needs Assistance Bed Mobility: Supine to Sit, Sit to Supine     Supine to sit: Mod assist Sit to supine: Min assist        Transfers Overall transfer level: Needs assistance Equipment used: Rolling walker (2 wheels) Transfers: Sit to/from Stand Sit to Stand: +2 physical assistance, Mod assist  General transfer comment: assist for lift  off, anterior weight translation and overall balance; limited spontaneous righting reactions    Ambulation/Gait Ambulation/Gait assistance: +2 safety/equipment, Mod assist, Min assist Gait Distance (Feet): 4 Feet Assistive device: Rolling walker (2 wheels)         General Gait Details: lateral stepping edge of bed, min/mod assist +2, for RW management, weight shift (required to initiate limb advancement).  Generally poor balance; unsafe to attempt without RW and +1-2 assist at all times.  Stairs            Wheelchair Mobility    Modified Rankin (Stroke Patients Only)       Balance Overall balance assessment: Needs assistance Sitting-balance support: No upper extremity supported, Feet supported Sitting balance-Leahy Scale: Poor Sitting balance - Comments: initially, requiring min/mod assist for static sitting balance; does improve to close sup with weight shifting and dynamic reaching activities     Standing balance-Leahy Scale: Poor Standing balance comment: min/mod assist +2 to attain/maintain due to heavy posterior bias (worsens with fatigue and divided attention)                             Pertinent Vitals/Pain Pain Assessment Pain Assessment: Faces Faces Pain Scale: No hurt    Home Living Family/patient expects to be discharged to:: Private residence Living Arrangements: Children Available Help at Discharge: Family;Available 24 hours/day Type of Home: House Home Access: Stairs to enter   [employer] of Steps: 2   Home Layout: One level Home Equipment: None      Prior Function Prior Level of Function : Needs assist             Mobility Comments: Sup/min assist for household mobility without assist device; progressive decline immediately prior to admission (to the point son was carrying to/from places) ADLs Comments: Daughter assists with ADLs     Hand Dominance        Extremity/Trunk Assessment   Upper Extremity  Assessment Upper Extremity Assessment: Generalized weakness (grossly 3-/5 throughout; no focal weakness appreciated)    Lower Extremity Assessment Lower Extremity Assessment: Generalized weakness (grossly 3-/5 throughout; no focal weakness appreciated)       Communication   Communication: No difficulties  Cognition Arousal/Alertness: Awake/alert Behavior During Therapy: Anxious Overall Cognitive Status: Difficult to assess                                 General Comments: Per chart, at baseline, oriented only to self; inconsistently recognizes family members.  Currently oriented to self; does follow simple commands (with some hand-over-hand and gestures); did recognize granddaughter that came into room during session        General Comments      Exercises     Assessment/Plan    PT Assessment Patient needs continued PT services  PT Problem List Decreased strength;Decreased range of motion;Decreased activity tolerance;Decreased balance;Decreased mobility;Decreased coordination;Decreased cognition;Decreased knowledge of use of DME;Decreased safety awareness;Decreased knowledge of precautions;Cardiopulmonary status limiting activity       PT Treatment Interventions DME instruction;Gait training;Stair training;Functional mobility training;Therapeutic activities;Therapeutic exercise;Balance training;Patient/family education    PT Goals (Current goals can be found in the Care Plan section)  Acute Rehab PT Goals Patient Stated Goal: to return home PT Goal Formulation: With patient Time For Goal Achievement: 06/18/22 Potential to Achieve Goals: Fair    Frequency Min 2X/week  Co-evaluation               AM-PAC PT "6 Clicks" Mobility  Outcome Measure Help needed turning from your back to your side while in a flat bed without using bedrails?: A Little Help needed moving from lying on your back to sitting on the side of a flat bed without using bedrails?:  A Little Help needed moving to and from a bed to a chair (including a wheelchair)?: A Lot Help needed standing up from a chair using your arms (e.g., wheelchair or bedside chair)?: A Lot Help needed to walk in hospital room?: A Lot Help needed climbing 3-5 steps with a railing? : A Lot 6 Click Score: 14    End of Session   Activity Tolerance: Patient tolerated treatment well Patient left: in bed;with call bell/phone within reach;with bed alarm set;with family/visitor present Nurse Communication: Mobility status PT Visit Diagnosis: Other abnormalities of gait and mobility (R26.89);Muscle weakness (generalized) (M62.81)    Time: 4970-2637 PT Time Calculation (min) (ACUTE ONLY): 27 min   Charges:   PT Evaluation $PT Eval Moderate Complexity: 1 Mod        Cherrelle Plante H. Manson Passey, PT, DPT, NCS 06/04/22, 3:55 PM [phone]

## 2022-06-05 DIAGNOSIS — U071 COVID-19: Secondary | ICD-10-CM | POA: Diagnosis not present

## 2022-06-05 DIAGNOSIS — J069 Acute upper respiratory infection, unspecified: Secondary | ICD-10-CM | POA: Diagnosis not present

## 2022-06-05 LAB — GLUCOSE, CAPILLARY
Glucose-Capillary: 146 mg/dL — ABNORMAL HIGH (ref 70–99)
Glucose-Capillary: 86 mg/dL (ref 70–99)
Glucose-Capillary: 87 mg/dL (ref 70–99)
Glucose-Capillary: 93 mg/dL (ref 70–99)

## 2022-06-05 LAB — CBC WITH DIFFERENTIAL/PLATELET
Abs Immature Granulocytes: 0.04 10*3/uL (ref 0.00–0.07)
Basophils Absolute: 0 10*3/uL (ref 0.0–0.1)
Basophils Relative: 0 %
Eosinophils Absolute: 0 10*3/uL (ref 0.0–0.5)
Eosinophils Relative: 0 %
HCT: 33 % — ABNORMAL LOW (ref 36.0–46.0)
Hemoglobin: 10.8 g/dL — ABNORMAL LOW (ref 12.0–15.0)
Immature Granulocytes: 0 %
Lymphocytes Relative: 7 %
Lymphs Abs: 0.8 10*3/uL (ref 0.7–4.0)
MCH: 30.6 pg (ref 26.0–34.0)
MCHC: 32.7 g/dL (ref 30.0–36.0)
MCV: 93.5 fL (ref 80.0–100.0)
Monocytes Absolute: 0.4 10*3/uL (ref 0.1–1.0)
Monocytes Relative: 3 %
Neutro Abs: 10.3 10*3/uL — ABNORMAL HIGH (ref 1.7–7.7)
Neutrophils Relative %: 90 %
Platelets: 106 10*3/uL — ABNORMAL LOW (ref 150–400)
RBC: 3.53 MIL/uL — ABNORMAL LOW (ref 3.87–5.11)
RDW: 13.1 % (ref 11.5–15.5)
WBC: 11.6 10*3/uL — ABNORMAL HIGH (ref 4.0–10.5)
nRBC: 0 % (ref 0.0–0.2)

## 2022-06-05 LAB — COMPREHENSIVE METABOLIC PANEL
ALT: 13 U/L (ref 0–44)
AST: 35 U/L (ref 15–41)
Albumin: 2.6 g/dL — ABNORMAL LOW (ref 3.5–5.0)
Alkaline Phosphatase: 33 U/L — ABNORMAL LOW (ref 38–126)
Anion gap: 7 (ref 5–15)
BUN: 28 mg/dL — ABNORMAL HIGH (ref 8–23)
CO2: 24 mmol/L (ref 22–32)
Calcium: 7.7 mg/dL — ABNORMAL LOW (ref 8.9–10.3)
Chloride: 113 mmol/L — ABNORMAL HIGH (ref 98–111)
Creatinine, Ser: 0.85 mg/dL (ref 0.44–1.00)
GFR, Estimated: >60 mL/min (ref >60)
Glucose, Bld: 99 mg/dL (ref 70–99)
Potassium: 3.2 mmol/L — ABNORMAL LOW (ref 3.5–5.1)
Sodium: 144 mmol/L (ref 135–145)
Total Bilirubin: 1.1 mg/dL (ref 0.3–1.2)
Total Protein: 5.9 g/dL — ABNORMAL LOW (ref 6.5–8.1)

## 2022-06-05 LAB — C-REACTIVE PROTEIN
CRP: 10.8 mg/dL — ABNORMAL HIGH (ref <1.0)
CRP: 19.4 mg/dL — ABNORMAL HIGH (ref <1.0)

## 2022-06-05 LAB — MAGNESIUM: Magnesium: 2.2 mg/dL (ref 1.7–2.4)

## 2022-06-05 MED ORDER — DM-GUAIFENESIN ER 30-600 MG PO TB12: 1.0000 | ORAL_TABLET | Freq: Two times a day (BID) | ORAL | 0 refills | Status: AC | PRN

## 2022-06-05 MED ORDER — ZINC 220 (50 ZN) MG PO CAPS: 1.0000 | ORAL_CAPSULE | Freq: Every day | ORAL | 0 refills | Status: AC

## 2022-06-05 MED ORDER — POTASSIUM CHLORIDE CRYS ER 20 MEQ PO TBCR
40.0000 meq | EXTENDED_RELEASE_TABLET | Freq: Once | ORAL | Status: AC
  Administered 2022-06-05: 40 meq via ORAL
  Filled 2022-06-05: qty 2

## 2022-06-05 MED ORDER — ADULT MULTIVITAMIN W/MINERALS CH: 1.0000 | ORAL_TABLET | Freq: Every day | ORAL | 0 refills | Status: AC

## 2022-06-05 MED ORDER — ASPIRIN 81 MG PO CHEW
81.0000 mg | CHEWABLE_TABLET | Freq: Every day | ORAL | Status: DC
  Administered 2022-06-05: 81 mg via ORAL
  Filled 2022-06-05: qty 1

## 2022-06-05 MED ORDER — ASPIRIN 81 MG PO CHEW: 81.0000 mg | CHEWABLE_TABLET | Freq: Every day | ORAL | 0 refills | Status: AC

## 2022-06-05 MED ORDER — CEFDINIR 300 MG PO CAPS: 300.0000 mg | ORAL_CAPSULE | Freq: Two times a day (BID) | ORAL | 0 refills | Status: AC

## 2022-06-05 MED ORDER — MOLNUPIRAVIR EUA 200MG CAPSULE: 4.0000 | ORAL_CAPSULE | Freq: Two times a day (BID) | ORAL | 0 refills | Status: AC

## 2022-06-05 MED ORDER — PREDNISONE 50 MG PO TABS
50.0000 mg | ORAL_TABLET | Freq: Every day | ORAL | Status: DC
  Administered 2022-06-05: 50 mg via ORAL
  Filled 2022-06-05: qty 1

## 2022-06-05 MED ORDER — PREDNISONE 50 MG PO TABS: ORAL_TABLET | ORAL | 0 refills | Status: AC

## 2022-06-05 MED ORDER — ENSURE ENLIVE PO LIQD: 237.0000 mL | Freq: Three times a day (TID) | ORAL | 12 refills | Status: AC

## 2022-06-05 MED ORDER — VITAMIN C 250 MG PO TABS: 250.0000 mg | ORAL_TABLET | Freq: Two times a day (BID) | ORAL | 0 refills | Status: AC

## 2022-06-05 MED ORDER — ALBUTEROL SULFATE HFA 108 (90 BASE) MCG/ACT IN AERS: 2.0000 | INHALATION_SPRAY | RESPIRATORY_TRACT | 1 refills | Status: AC | PRN

## 2022-06-05 NOTE — Progress Notes (Cosign Needed)
Patient suffers from covid and weaknesswhich impairs their ability to perform daily activities like ADLS  in the home.  A walking aide will not resolve  issue with performing activities of daily living. A wheelchair will allow patient to safely perform daily activities. Patient is not able to propel themselves in the home using a standard weight wheelchair due to weakness. Patient can self propel in the lightweight wheelchair. Length of need 6 months. Accessories: elevating leg rests (ELRs), wheel locks, extensions and anti-tippers. Back cushion

## 2022-06-05 NOTE — TOC Progression Note (Signed)
Transition of Care [facility]) - Progression Note    Patient Details  Name: Candas Deemer MRN: [id number] Date of Birth: 05-22-1933  Transition of [facility]) CM/SW Contact  Marlowe Sax, RN Phone Number: 06/05/2022, 11:18 AM  Clinical Narrative:    Spoke with the patient's daughter Victorino Dike She stated that the patient will need a 3 in 1 And a light weight wheelchair, they want it delivered to the home Patient will need EMS to transport home Confirmed the address The patient will not need a hospital bed They want HH also [facility] has accepted the patient for [facility]        Expected Discharge Plan and Services           Expected Discharge Date: 06/05/22                                     Social Determinants of Health (SDOH) Interventions    Readmission Risk Interventions     No data to display

## 2022-06-05 NOTE — Discharge Summary (Signed)
Physician Discharge Summary   Patient: Gloria Mosley MRN: [id number] DOB: May 15, 1933  Admit date:     06/03/2022  Discharge date: 06/05/22  Discharge Physician: Arnetha Courser   PCP: Malva Limes, MD   Recommendations at discharge:  Please obtain CBC and BMP in 1 week Please obtain CRP in 1 week Follow-up with primary care provider within a week. Follow-up on final urinary culture results-susceptibility pending.  Discharge Diagnoses: Principal Problem:   Acute respiratory disease due to COVID-19 virus Active Problems:   Acute metabolic encephalopathy   UTI (urinary tract infection)   Sepsis (HCC)   Moderate dementia without behavioral disturbance, psychotic disturbance, mood disturbance, or anxiety (HCC)   SVT (supraventricular tachycardia) (HCC)   Chronic kidney disease, stage 3b (HCC)   Essential (primary) hypertension   Protein-calorie malnutrition, severe (HCC)   Myocardial injury   Thrombocytopenia [facility])   Hospital Course: Taken from H&P.  Jennine Peddy is a 88 y.o. female with medical history significant of hypertension, hyperlipidemia, CKD-3B, dementia, osteoporosis, who presents with altered mental status and cough.   Patient has AMS,  and is unable to provide accurate medical history.  I called Gloria Mosley son by phone, who provided medical history.  Per his son, at Gloria Mosley normal baseline, patient is orientated x 3, interacting with family members.  In the past several days, patient has been confused, less reactive, has poor appetite and decreased oral intake.  Family members has COVID infection.  Patient also developed cough and mild shortness of breath, with little sputum production.  Did not complain of chest pain.  No fever or chills.  Patient also has increased urinary frequency, not sure if patient has dysuria or burning on urination.  No active nausea vomiting or diarrhea noted.  Did not complain abdominal pain for Gloria Mosley son.  Patient moves all extremities.   Patient was found to  have SVT with heart rate up to 196, which improved to 140s then to 80s after treated with IV 5 mg of Cardizem and 5 mg of metoprolol in ED.  Data reviewed independently and ED Course: pt was found to have positive COVID PCR, positive urinalysis (cloudy appearance, large amount of leukocyte, many bacteria, WBC 21-25), lactic acid 1.3 --> 1.3, INR 1.0, PTT 34, troponin level 21, renal function close to baseline, WBC 5.9, platelet 115, VBG with pH 7.35, CO2 53, O2 <31.  Temperature normal, blood pressure 116/82, RR 27, oxygen saturation 91%, which improved to 98% on 2 L oxygen.  Chest x-ray negative.  CT head negative.     CTA: 1. No pulmonary embolism. 2. Mild multifocal airway impaction and tree-in-bud nodularity in keeping with acute infection or chronic atypical infection such as mycobacterial or atypical fungal infection, or aspiration. 3. Asymmetrically extensive airway impaction within the dependent right lower lobe with associated bronchial wall thickening and widespread ground-glass opacity in keeping with aspiration or acute infection. 4. Multifocal air trapping likely related to small airways disease. 5. Mild coronary artery calcification.   Aortic Atherosclerosis (ICD10-I70.0).   EKG: I have personally reviewed.  SVT, QTc 506, heart rate 196, nonspecific T wave change.  8/12: Hemodynamically stable, saturating 100% on room air.  Procalcitonin negative. She was started on ceftriaxone for concern of UTI, urine cultures with Klebsiella pneumonia and E. coli, pending susceptibility. She was started on molnupiravir for COVID-19 pneumonia Discussed with daughter and at baseline patient is oriented to self only, she was able to walk around somewhat although very slow few days ago, daughter helps  Gloria Mosley with ADLs and he lives with Gloria Mosley son.  At times she does not recognizes Gloria Mosley daughter but she do recognize Gloria Mosley son.  Patient has advanced dementia at baseline and will be more prone to get delirium  while in the hospital. We discussed again Gloria Mosley CODE STATUS and daughter will discuss with Gloria Mosley brother but would like to keep Gloria Mosley as full code despite understanding that she is very fragile and performing CPR will harm Gloria Mosley more than giving any benefit. We will keep Gloria Mosley full code at this time-palliative care was also consulted.  8/13: CRP was elevated at 10.5, she was started on prednisone.  Remained on room air. PT and OT are recommending home health services.  Patient otherwise stable and at Gloria Mosley baseline.  She wants to go home and is being discharged home to complete the course of molnupiravir and also given cefdinir for concern of UTI, susceptibility still pending.  Patient will continue on current medication and will follow-up with Gloria Mosley primary care provider for further recommendations.   Assessment and Plan: * Acute respiratory disease due to COVID-19 virus Patient was weaned off to room air.  No obvious respiratory distress. Oriented to name only and refusing a lot of care. CTA with bilateral infiltrate concerning for atypical/viral pneumonia.  Procalcitonin negative. -Started molnupiravir-day 2 -Continue with supportive care -Check CRP-if elevated we can start Gloria Mosley on steroid.  Acute metabolic encephalopathy Likely due to multifactorial etiology, including UTI and COVID-19 infection.  CT head negative.  Patient moves all extremities and oriented to name only. -Continue to monitor  UTI (urinary tract infection) Urine cultures with Klebsiella pneumonia and E. coli-pending susceptibility. - IV Rocephin -Follow-up of urine culture  Sepsis [facility]) Patient meets criteria for sepsis with tachycardia and tachypnea with RR 27.  This is due to combination of UTI and COVID infection.  Lactic acid normal 1.3, 1.3.  Procalcitonin negative. Received fluid and started on ceftriaxone for concern of UTI.   Moderate dementia without behavioral disturbance, psychotic disturbance, mood disturbance, or  anxiety (HCC) - Fall precaution -Delirium precautions  SVT (supraventricular tachycardia) (HCC) Resolved. Heart rate improved from initial 196 --> 80s.  TSH normal 2.461.  This is likely triggered by ongoing infection -Patient received 5 mg of Cardizem and 5 mg of metoprolol in ED -As needed metoprolol 2.5 mg every 3 hours   Chronic kidney disease, stage 3b (HCC) Renal function is close to baseline.  Recent baseline creatinine 1.2-1.5.  Gloria Mosley creatinine is 1.46, BUN 35 -Monitored renal function by BMP -Avoid nephrotoxins  Essential (primary) hypertension Blood pressure within goal. - IV hydralazine as needed -Hold spironolactone since patient has decreased oral intake  Protein-calorie malnutrition, severe (HCC) Body weight 46.7 kg, BMI 17.67 - Consult nutrition   Myocardial injury Unable to tell any chest pain or shortness of breath.  Troponin positive with a flat curve, most likely secondary to demand ischemia with current illness. A1c of 5.3 and lipid panel normal. -asa 81 mg daily   Thrombocytopenia (HCC) Platelet 115>>103.  Likely due to ongoing infection.  LDH normal -Continue to monitor         Consultants: None Procedures performed: None Disposition: Home health Diet recommendation:  Discharge Diet Orders (From admission, onward)     Start     Ordered   06/05/22 0000  Diet - low sodium heart healthy        06/05/22 1117           Cardiac diet DISCHARGE MEDICATION: Allergies as  of 06/05/2022       Reactions   Alendronate    diarrhea   Sulfa Antibiotics    Other reaction(s): Other (See Comments) Does not tolerate well        Medication List     TAKE these medications    albuterol 108 (90 Base) MCG/ACT inhaler Commonly known as: VENTOLIN HFA Inhale 2 puffs into the lungs every 4 (four) hours as needed for wheezing or shortness of breath.   aspirin 81 MG chewable tablet Chew 1 tablet (81 mg total) by mouth daily. Start taking on: June 06, 2022   cefdinir 300 MG capsule Commonly known as: OMNICEF Take 1 capsule (300 mg total) by mouth 2 (two) times daily for 4 days.   Cholecalciferol 50 MCG (2000 UT) Caps Take 2,000 Units by mouth daily.   cyanocobalamin 1000 MCG tablet Commonly known as: VITAMIN B12 Take 1,000 mcg by mouth daily.   denosumab 60 MG/ML Sosy injection Commonly known as: PROLIA Inject 60 mg into the skin every 6 (six) months.   dextromethorphan-guaiFENesin 30-600 MG 12hr tablet Commonly known as: MUCINEX DM Take 1 tablet by mouth 2 (two) times daily as needed for cough.   feeding supplement Liqd Take 237 mLs by mouth 3 (three) times daily between meals.   fluticasone 50 MCG/ACT nasal spray Commonly known as: FLONASE Place 2 sprays into both nostrils daily.   molnupiravir EUA 200 mg Caps capsule Commonly known as: LAGEVRIO Take 4 capsules (800 mg total) by mouth 2 (two) times daily for 5 days.   multivitamin with minerals Tabs tablet Take 1 tablet by mouth daily. Start taking on: June 06, 2022   predniSONE 50 MG tablet Commonly known as: DELTASONE Take 1 tablet daily for 1 week Start taking on: June 06, 2022   risperiDONE 0.5 MG tablet Commonly known as: RISPERDAL TAKE ONE TABLET BY MOUTH EVERY NIGHT AT BEDTIME   spironolactone 25 MG tablet Commonly known as: ALDACTONE TAKE ONE TABLET BY MOUTH DAILY   vitamin C 250 MG tablet Commonly known as: ASCORBIC ACID Take 1 tablet (250 mg total) by mouth 2 (two) times daily.   Zinc 220 (50 Zn) MG Caps Take 1 capsule by mouth daily.               Durable Medical Equipment  (From admission, onward)           Start     Ordered   06/05/22 1104  For home use only DME Shower stool  Once        06/05/22 1103   06/04/22 1557  For home use only DME 3 n 1  Once       Comments: Patient is not able to walk the distance required to go the bathroom, or he/she is unable to safely negotiate stairs required to access the bathroom.  A  3in1 BSC will alleviate this problem   06/04/22 1557   06/04/22 1557  For home use only DME Hospital bed  Once       Question Answer Comment  Length of Need Lifetime   The above medical condition requires: Patient requires the ability to reposition frequently   Bed type Semi-electric      06/04/22 1557   06/04/22 1556  For home use only DME Walker rolling  Once       Question Answer Comment  Walker: With 5 Inch Wheels   Patient needs a walker to treat with the following condition Generalized weakness  06/04/22 1557   06/04/22 1556  For home use only DME lightweight manual wheelchair with seat cushion  Once       Comments: Patient suffers from acute respiratory failure (COVID-19) and sepsis which impairs their ability to perform daily activities like toileting in the home.  A walker will not resolve  issue with performing activities of daily living. A wheelchair will allow patient to safely perform daily activities. Patient is not able to propel themselves in the home using a standard weight wheelchair due to general weakness. Patient can self propel in the lightweight wheelchair. Length of need Lifetime. Accessories: elevating leg rests (ELRs), wheel locks, extensions and anti-tippers.   06/04/22 1557            Follow-up Information     Malva Limes, MD. Schedule an appointment as soon as possible for a visit in 1 week(s).   Specialty: Family Medicine Contact information: [address] [place] [place] [address] [place] [address] [phone]                Discharge Exam: Ceasar Mons Weights   06/03/22 0300  Weight: 46.7 kg   General.  Frail and malnourished lady, in no acute distress. Pulmonary.  Lungs clear bilaterally, normal respiratory effort. CV.  Regular rate and rhythm, no JVD, rub or murmur. Abdomen.  Soft, nontender, nondistended, BS positive. CNS.  Alert and oriented to self only.  No focal neurologic deficit. Extremities.  No edema, no cyanosis, pulses  intact and symmetrical. Psychiatry.  Judgment and insight appears impaired.   Condition at discharge: stable  The results of significant diagnostics from this hospitalization (including imaging, microbiology, ancillary and laboratory) are listed below for reference.   Imaging Studies: CT Angio Chest PE W and/or Wo Contrast  Result Date: 06/03/2022 CLINICAL DATA:  Pulmonary embolism (PE) suspected, high prob. COVID pneumonia, generalized weakness. EXAM: CT ANGIOGRAPHY CHEST WITH CONTRAST TECHNIQUE: Multidetector CT imaging of the chest was performed using the standard protocol during bolus administration of intravenous contrast. Multiplanar CT image reconstructions and MIPs were obtained to evaluate the vascular anatomy. RADIATION DOSE REDUCTION: This exam was performed according to the departmental dose-optimization program which includes automated exposure control, adjustment of the mA and/or kV according to patient size and/or use of iterative reconstruction technique. CONTRAST:  14mL OMNIPAQUE IOHEXOL 350 MG/ML SOLN COMPARISON:  None Available. FINDINGS: Cardiovascular: Adequate opacification of the pulmonary arterial tree. No intraluminal filling defect identified to suggest acute pulmonary embolism. Central pulmonary arteries are of normal caliber. Mild coronary artery calcification. Cardiac size within normal limits. No pericardial effusion. Moderate atherosclerotic calcification within the thoracic aorta. No aortic aneurysm. Mediastinum/Nodes: No pathologic thoracic adenopathy. Esophagus is unremarkable. Lungs/Pleura: There is mild diffuse bronchiectasis with scattered areas of airway impaction within the lingula, medial left upper lobe and right upper lobe with tree-in-bud nodularity best appreciated within the right upper lobe. Together, the findings can be seen the setting of acute infection, chronic atypical infection including mycobacterial or atypical fungal infection, or aspiration. Mosaic  attenuation throughout the pulmonary parenchyma is in keeping with multifocal air trapping likely related to small airways disease. Asymmetrically, there is extensive airway impaction within the dependent right lower lobe with associated bronchial wall thickening and widespread ground-glass opacity in this region in keeping with changes of aspiration or acute infection. No pneumothorax or pleural effusion.  No central obstructing mass. Upper Abdomen: Status post cholecystectomy.  No acute abnormality. Musculoskeletal: No acute bone abnormality. No lytic or blastic bone lesion. Review of the  MIP images confirms the above findings. IMPRESSION: 1. No pulmonary embolism. 2. Mild multifocal airway impaction and tree-in-bud nodularity in keeping with acute infection or chronic atypical infection such as mycobacterial or atypical fungal infection, or aspiration. 3. Asymmetrically extensive airway impaction within the dependent right lower lobe with associated bronchial wall thickening and widespread ground-glass opacity in keeping with aspiration or acute infection. 4. Multifocal air trapping likely related to small airways disease. 5. Mild coronary artery calcification. Aortic Atherosclerosis (ICD10-I70.0). Electronically Signed   By: Helyn Numbers M.D.   On: 06/03/2022 02:54   CT HEAD WO CONTRAST ( )  Result Date: 06/03/2022 CLINICAL DATA:  Mental status change, unknown cause Generalized weakness. EXAM: CT HEAD WITHOUT CONTRAST TECHNIQUE: Contiguous axial images were obtained from the base of the skull through the vertex without intravenous contrast. RADIATION DOSE REDUCTION: This exam was performed according to the departmental dose-optimization program which includes automated exposure control, adjustment of the mA and/or kV according to patient size and/or use of iterative reconstruction technique. COMPARISON:  None Available. FINDINGS: Brain: No intracranial hemorrhage, mass effect, or midline shift. Normal  for age atrophy. No hydrocephalus. The basilar cisterns are patent. No evidence of territorial infarct or acute ischemia. No extra-axial or intracranial fluid collection. Vascular: Atherosclerosis of skullbase vasculature without hyperdense vessel or abnormal calcification. Skull: No fracture or focal lesion. Sinuses/Orbits: Small fluid levels in the sphenoid sinuses with frothy debris. Scattered mucosal thickening of the ethmoid air cells. Occasional opacification of lower bilateral mastoid air cells. Other: None. IMPRESSION: 1. No acute intracranial abnormality. 2. Mild paranasal sinus inflammation with fluid levels in the sphenoid sinuses, may be secondary to acute sinusitis. Electronically Signed   By: Narda Rutherford M.D.   On: 06/03/2022 02:41   DG Chest 1 View  Result Date: 06/02/2022 CLINICAL DATA:  COVID, generalized weakness EXAM: CHEST  1 VIEW COMPARISON:  None Available. FINDINGS: Cardiac and mediastinal contours are within normal limits. No focal pulmonary opacity. No pleural effusion or pneumothorax. No acute osseous abnormality. IMPRESSION: No acute cardiopulmonary process. Electronically Signed   By: Wiliam Ke M.D.   On: 06/02/2022 20:03    Microbiology: Results for orders placed or performed during the hospital encounter of 06/03/22  SARS Coronavirus 2 by RT PCR (hospital order, performed in [facility] hospital lab) *cepheid single result test* Anterior Nasal Swab     Status: Abnormal   Collection Time: 06/02/22  7:28 PM   Specimen: Anterior Nasal Swab  Result Value Ref Range Status   SARS Coronavirus 2 by RT PCR POSITIVE (A) NEGATIVE Final    Comment: (NOTE) SARS-CoV-2 target nucleic acids are DETECTED  SARS-CoV-2 RNA is generally detectable in upper respiratory specimens  during the acute phase of infection.  Positive results are indicative  of the presence of the identified virus, but do not rule out bacterial infection or co-infection with other pathogens not detected  by the test.  Clinical correlation with patient history and  other diagnostic information is necessary to determine patient infection status.  The expected result is negative.  Fact Sheet for Patients:   RoadLapTop.co.za   Fact Sheet for Healthcare Providers:   http://kim-miller.com/    This test is not yet approved or cleared by the [place] FDA and  has been authorized for detection and/or diagnosis of SARS-CoV-2 by FDA under an Emergency Use Authorization (EUA).  This EUA will remain in effect (meaning this test can be used) for the duration of  the COVID-19 declaration under Section 564(b)(1)  of the Act, 21 U.S.C. section 360-bbb-3(b)(1), unless the authorization is terminated or revoked sooner.   Performed at [facility], [address] [place], [place] [address]   Urine Culture     Status: Abnormal (Preliminary result)   Collection Time: 06/03/22  1:27 AM   Specimen: Urine, Random  Result Value Ref Range Status   Specimen Description   Final    URINE, RANDOM Performed at [facility], [address] [place], [place] [address]    Special Requests   Final    NONE Performed at [facility], [address] [place], [place] [address]    Culture (A)  Final    >=100,000 COLONIES/mL KLEBSIELLA PNEUMONIAE >=100,000 COLONIES/mL ESCHERICHIA COLI SUSCEPTIBILITIES TO FOLLOW Performed at [facility], [address] [place], [place] [address]    Report Status PENDING  Incomplete    Labs: CBC: Recent Labs  Lab 06/02/22 1925 06/04/22 0745 06/05/22 0454  WBC 5.9 12.6* 11.6*  NEUTROABS 3.8 10.9* 10.3*  HGB 12.0 11.6* 10.8*  HCT 36.9 35.9* 33.0*  MCV 92.7 93.0 93.5  PLT 115* 103* 106*   Basic Metabolic Panel: Recent Labs  Lab 06/02/22 1925 06/02/22 2351 06/04/22 0745 06/05/22 0454 06/05/22 0926  NA 140  --  142 144  --   K 4.0  --  3.4* 3.2*  --   CL 105  --  110 113*  --    CO2 26  --  21* 24  --   GLUCOSE 87  --  100* 99  --   BUN 35*  --  33* 28*  --   CREATININE 1.46*  --  1.19* 0.85  --   CALCIUM 8.9  --  7.9* 7.7*  --   MG  --  2.2  --   --  2.2   Liver Function Tests: Recent Labs  Lab 06/02/22 1925 06/04/22 0745 06/05/22 0454  AST 35 40 35  ALT 18 16 13   ALKPHOS 47 31* 33*  BILITOT 0.7 1.2 1.1  PROT 7.6 6.4* 5.9*  ALBUMIN 3.7 2.9* 2.6*   CBG: Recent Labs  Lab 06/04/22 1619 06/04/22 2144 06/05/22 0033 06/05/22 0407 06/05/22 0917  GLUCAP 78 107* 93 86 87    Discharge time spent: greater than 30 minutes.  This record has been created using Conservation officer, historic buildingsDragon voice recognition software. Errors have been sought and corrected,but may not always be located. Such creation errors do not reflect on the standard of care.   Signed: Arnetha CourserSumayya Pascual Mantel, MD [facility] 06/05/2022

## 2022-06-05 NOTE — Progress Notes (Cosign Needed)
Patient is not able to walk the distance required to go the bathroom, or he/she is unable to safely negotiate stairs required to access the bathroom.  A 3in1 BSC will alleviate this problem  

## 2022-06-05 NOTE — TOC Progression Note (Signed)
Transition of Care [facility]) - Progression Note    Patient Details  Name: Jennel Mara MRN: [id number] Date of Birth: [birth year]  [facility] CM/SW Contact  Marlowe Sax, RN Phone Number: 06/05/2022, 11:53 AM  Clinical Narrative:   Called EMS to transport the patient to home, she is next on the list to pick up    Expected Discharge Plan: Home w Home Health Services Barriers to Discharge: Barriers Resolved  Expected Discharge Plan and Services Expected Discharge Plan: Home w Home Health Services   Discharge Planning Services: CM Consult     Expected Discharge Date: 06/05/22               DME Arranged: Dan Humphreys rolling, Lightweight manual wheelchair with seat cushion DME Agency: [employer] Date DME Agency Contacted: 06/05/22 Time DME Agency Contacted: 1126 Representative spoke with at DME Agency: Leavy Cella HH Arranged: PT, RN HH Agency: [facility] Date HH Agency Contacted: 06/05/22 Time HH Agency Contacted: 1127 Representative spoke with at [facility] Agency: Barbara Cower   Social Determinants of Health (SDOH) Interventions    Readmission Risk Interventions     No data to display

## 2022-06-05 NOTE — Plan of Care (Signed)

## 2022-06-06 ENCOUNTER — Other Ambulatory Visit: Payer: Self-pay | Admitting: *Deleted

## 2022-06-06 DIAGNOSIS — R918 Other nonspecific abnormal finding of lung field: Secondary | ICD-10-CM | POA: Diagnosis not present

## 2022-06-06 DIAGNOSIS — Z20822 Contact with and (suspected) exposure to covid-19: Secondary | ICD-10-CM | POA: Diagnosis not present

## 2022-06-06 DIAGNOSIS — R531 Weakness: Secondary | ICD-10-CM | POA: Diagnosis not present

## 2022-06-06 DIAGNOSIS — R0602 Shortness of breath: Secondary | ICD-10-CM | POA: Diagnosis not present

## 2022-06-06 DIAGNOSIS — U071 COVID-19: Secondary | ICD-10-CM | POA: Diagnosis not present

## 2022-06-06 LAB — URINE CULTURE: Culture: >=100000 — AB

## 2022-06-06 NOTE — Patient Outreach (Signed)
  Care Coordination [facility] Note Transition Care Management Follow-up Telephone Call Date of discharge and from where: 06/05/22 [facility] How have you been since you were released from the hospital? Daughter states the patient is not doing well and that the hospital discharged her to home to soon. Daughter states that her brother is currently with the patient and all are extremely overwhelmed with the amount of care the patient requires. Daughter Victorino Dike reports being very upset that the patient was not discharged to a SNF. Victorino Dike explained that she and other family members plan to go over to the patient's house and from there they are going to call EMS to take her back to the ED. Victorino Dike adds that they will not be going back to the [facility] system. It is their plan to get the patient to another health system ED. Nurse apologized, and stated that given what the daughter described that the patient did need to go to ED for her safety and wellbeing.  Any questions or concerns? Yes, as above.  Items Reviewed: Did the pt receive and understand the discharge instructions provided? Yes  Medications obtained and verified? Yes  Other? Yes , daughter explained that her mother does not swallow well and the amount of medication they prescribed at discharge is a "ridiculous" amount for them to have to crush and give to the patient. Any new allergies since your discharge? No  Dietary orders reviewed? No Do you have support at home? Yes , daughter states her brother is with the patient presently but all are overwhelmed with the amount of care she currently requires.  Home Care and Equipment/Supplies: Were home health services ordered? yes If so, what is the name of the agency? [facility]  Has the agency set up a time to come to the patient's home? yes Were any new equipment or medical supplies ordered?  Yes: walker, wheelchair What is the name of the medical supply agency? [facility] Were you able to get the  supplies/equipment? yes Do you have any questions related to the use of the equipment or supplies? No  Functional Questionnaire: (I = Independent and D = Dependent) ADLs: D  Bathing/Dressing- D  Meal Prep- D  Eating- D  Maintaining continence- D  Transferring/Ambulation- D  Managing Meds- D  Follow up appointments reviewed:  PCP Hospital f/u appt confirmed? No , daughter states the patient is going back to the ED today. The family is not able to care for her at home.  Specialist Hospital f/u appt confirmed? No   Are transportation arrangements needed? Yes , daughter states she and family will call EMS to transport the patient back to the ED If their condition worsens, is the pt aware to call PCP or go to the Emergency Dept.? Yes Was the patient provided with contact information for the PCP's office or ED? Yes Was to pt encouraged to call back with questions or concerns? Yes  SDOH assessments and interventions completed:   No  Care Coordination Interventions Activated:  No   Care Coordination Interventions:   N/A     Encounter Outcome:  Pt. Visit Completed

## 2022-06-07 DIAGNOSIS — R531 Weakness: Secondary | ICD-10-CM | POA: Diagnosis not present

## 2022-06-09 DIAGNOSIS — R918 Other nonspecific abnormal finding of lung field: Secondary | ICD-10-CM | POA: Diagnosis not present

## 2022-06-23 ENCOUNTER — Telehealth: Payer: Self-pay

## 2022-06-23 NOTE — Telephone Encounter (Signed)
Copied from CRM [id number]. Topic: General - Other >> Jun 23, 2022 10:25 AM Franchot Heidelberg wrote: Reason for CRM: Pt's daughter called wanting to notify PCP of pt's passing.

## 2022-06-24 DEATH — deceased
